# Patient Record
Sex: Female | Born: 2000 | Race: Black or African American | Hispanic: Yes | Marital: Single | Smoking: Never smoker
Health system: Southern US, Community
[De-identification: ages and names within clinical notes are randomized; demographics above are authoritative.]

## PROBLEM LIST (undated history)

## (undated) HISTORY — PX: WISDOM TOOTH EXTRACTION: SHX21

---
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
## 2021-05-22 ENCOUNTER — Encounter: Payer: Self-pay | Admitting: Allergy & Immunology

## 2021-05-22 ENCOUNTER — Ambulatory Visit (INDEPENDENT_AMBULATORY_CARE_PROVIDER_SITE_OTHER): Payer: Medicaid Other | Admitting: Allergy & Immunology

## 2021-05-22 ENCOUNTER — Other Ambulatory Visit: Payer: Self-pay

## 2021-05-22 VITALS — BP 118/80 | HR 84 | Temp 98.0°F | Resp 20 | Ht 62.0 in | Wt 203.0 lb

## 2021-05-22 DIAGNOSIS — J310 Chronic rhinitis: Secondary | ICD-10-CM | POA: Diagnosis not present

## 2021-05-22 DIAGNOSIS — J4520 Mild intermittent asthma, uncomplicated: Secondary | ICD-10-CM | POA: Diagnosis not present

## 2021-05-22 DIAGNOSIS — J45909 Unspecified asthma, uncomplicated: Secondary | ICD-10-CM | POA: Diagnosis not present

## 2021-05-22 DIAGNOSIS — J45998 Other asthma: Secondary | ICD-10-CM | POA: Diagnosis not present

## 2021-05-22 NOTE — Progress Notes (Signed)
FOLLOW UP  Date of Service/Encounter:  05/22/21   Assessment:   Mild intermittent asthma, uncomplicated   Non-allergic rhinitis   Plan/Recommendations:   1. Mild intermittent asthma, uncomplicated - Lung testing looked good today. - I do not think that we need any systemic steroids today such as prednisone. - I would like to start Flovent 110 mcg 2 puffs twice daily for 2 weeks to help decrease inflammation in the lungs. - After that, you can use this as needed for respiratory flares. - Spacer sample and demonstration provided. - Daily controller medication(s): NOTHING - Prior to physical activity: albuterol 2 puffs 10-15 minutes before physical activity. - Rescue medications: albuterol 4 puffs every 4-6 hours as needed - Changes during respiratory infections or worsening symptoms: Add on Flovent 137mcg to 2 puffs twice daily for TWO WEEKS. - Asthma control goals:  * Full participation in all desired activities (may need albuterol before activity) * Albuterol use two time or less a week on average (not counting use with activity) * Cough interfering with sleep two time or less a month * Oral steroids no more than once a year * No hospitalizations  2. Chronic non-allergic rhinitis - Continue with: Zyrtec (cetirizine) 10mg  tablet once daily as needed   3. Return in about 3 months (around 08/20/2021).    Subjective:   Nicole Mcneil is a 21 y.o. female presenting today for follow up of  Chief Complaint  Patient presents with   Asthma    Few months ago was acting up but patient thinks it was because she was around a lot of dust. Chest has been hurting lately.    Nicole Mcneil has a history of the following: Patient Active Problem List   Diagnosis Date Noted   Generalized anxiety disorder 11/04/2019   Encounter for counseling regarding initiation of other contraceptive measure 11/04/2019   PERSONAL HISTORY OF ALLERGY TO PEANUTS 12/11/2007    History obtained  from: chart review and patient.  Nicole Mcneil is a 21 y.o. female presenting for a follow up visit.  She was last seen in July 2021.  At that time, she tolerated a peanut butter challenge without adverse event.  Her asthma has been under control with albuterol 2 puffs every 4-6 hours as needed.  She underwent testing to environmental allergens in May 2021 that was negative to the entire panel.  We continued her on cetirizine 10 mg daily.  Since last visit, she has mostly done well.  She does report that over the past 6 weeks she has had intermittent chest tightness.  She thought it was from anxiety at first and tried breathing exercises and relaxation exercises without much relief.  She finally tried some albuterol with improvement.  Around the time that it started, she was around dust and fumes.  She thinks this might have triggered it.  She has had no fevers during this time.  In September 2022, she was diagnosed with bronchitis and treated with antibiotics and prednisone.  She continues to keep peanuts and tree nuts in her diet.  Otherwise, there have been no changes to her past medical history, surgical history, family history, or social history.    Review of Systems  Constitutional: Negative.  Negative for chills, fever, malaise/fatigue and weight loss.  HENT:  Positive for congestion. Negative for ear discharge, ear pain and sinus pain.   Eyes:  Negative for pain, discharge and redness.  Respiratory:  Negative for cough, sputum production, shortness of breath and wheezing.  Cardiovascular: Negative.  Negative for chest pain and palpitations.  Gastrointestinal:  Negative for abdominal pain, constipation, diarrhea, heartburn, nausea and vomiting.  Skin: Negative.  Negative for itching and rash.  Neurological:  Negative for dizziness and headaches.  Endo/Heme/Allergies:  Positive for environmental allergies. Does not bruise/bleed easily.      Objective:   Blood pressure 118/80, pulse 84,  temperature 98 F (36.7 C), temperature source Temporal, resp. rate 20, height 5\' 2"  (1.575 m), weight 203 lb (92.1 kg), SpO2 99 %. Body mass index is 37.13 kg/m.   Physical Exam:  Physical Exam Vitals reviewed.  Constitutional:      Appearance: She is well-developed.  HENT:     Head: Normocephalic and atraumatic.     Right Ear: Tympanic membrane, ear canal and external ear normal.     Left Ear: Tympanic membrane, ear canal and external ear normal.     Nose: No nasal deformity, septal deviation, mucosal edema or rhinorrhea.     Right Turbinates: Enlarged. Not swollen.     Left Turbinates: Enlarged. Not swollen.     Right Sinus: No maxillary sinus tenderness or frontal sinus tenderness.     Left Sinus: No maxillary sinus tenderness or frontal sinus tenderness.     Mouth/Throat:     Mouth: Mucous membranes are not pale and not dry.     Pharynx: Uvula midline.  Eyes:     General: Lids are normal. No allergic shiner.       Right eye: No discharge.        Left eye: No discharge.     Conjunctiva/sclera: Conjunctivae normal.     Right eye: Right conjunctiva is not injected. No chemosis.    Left eye: Left conjunctiva is not injected. No chemosis.    Pupils: Pupils are equal, round, and reactive to light.  Cardiovascular:     Rate and Rhythm: Normal rate and regular rhythm.     Heart sounds: Normal heart sounds.  Pulmonary:     Effort: Pulmonary effort is normal. No tachypnea, accessory muscle usage or respiratory distress.     Breath sounds: Normal breath sounds. No wheezing, rhonchi or rales.     Comments: Moving air well in all lung fields. No increased work of breathing noted.  Chest:     Chest wall: No tenderness.  Lymphadenopathy:     Cervical: No cervical adenopathy.  Skin:    General: Skin is warm.     Capillary Refill: Capillary refill takes less than 2 seconds.     Coloration: Skin is not pale.     Findings: No abrasion, erythema, petechiae or rash. Rash is not  papular, urticarial or vesicular.  Neurological:     Mental Status: She is alert.  Psychiatric:        Behavior: Behavior is cooperative.     Diagnostic studies:   Spirometry: results normal (FEV1: 2.43/84%, FVC: 3.08/95%, FEV1/FVC: 79%).    Spirometry consistent with normal pattern.   Allergy Studies: none        Salvatore Marvel, MD  [facility]

## 2021-05-22 NOTE — Patient Instructions (Addendum)
1. Mild intermittent asthma, uncomplicated - Lung testing looked good today. - I do not think that we need any systemic steroids today such as prednisone. - I would like to start Flovent 110 mcg 2 puffs twice daily for 2 weeks to help decrease inflammation in the lungs. - After that, you can use this as needed for respiratory flares. - Spacer sample and demonstration provided. - Daily controller medication(s): NOTHING - Prior to physical activity: albuterol 2 puffs 10-15 minutes before physical activity. - Rescue medications: albuterol 4 puffs every 4-6 hours as needed - Changes during respiratory infections or worsening symptoms: Add on Flovent to 2 puffs twice daily for TWO WEEKS. - Asthma control goals:  * Full participation in all desired activities (may need albuterol before activity) * Albuterol use two time or less a week on average (not counting use with activity) * Cough interfering with sleep two time or less a month * Oral steroids no more than once a year * No hospitalizations  2. Chronic non-allergic rhinitis - Continue with: Zyrtec (cetirizine) 10mg  tablet once daily as needed   3. Return in about 3 months (around 08/20/2021).    Please inform 10/20/2021 of any Emergency Department visits, hospitalizations, or changes in symptoms. Call us before going to the ED for breathing or allergy symptoms since we might be able to fit you in for a sick visit. Feel free to contact us anytime with any questions, problems, or concerns.  It was a pleasure to see you again today!  Websites that have reliable patient information: 1. American Academy of Asthma, Allergy, and Immunology: www.aaaai.org 2. Food Allergy Research and Education (FARE): foodallergy.org 3. Mothers of Asthmatics: http://www.asthmacommunitynetwork.org 4. American College of Allergy, Asthma, and Immunology: www.acaai.org   COVID-19 Vaccine Information can be found at:  [place] For questions related to vaccine distribution or appointments, please email [email or web address] or call [phone].   We realize that you might be concerned about having an allergic reaction to the COVID19 vaccines. To help with that concern, WE ARE OFFERING THE COVID19 VACCINES IN OUR OFFICE! Ask the front desk for dates!     Like [phone] on [place] and Instagram for our latest updates!      A healthy democracy works best when [employer] participate! Make sure you are registered to vote! If you have moved or changed any of your contact information, you will need to get this updated before voting!  In some cases, you MAY be able to register to vote online: [employer]

## 2021-08-02 ENCOUNTER — Encounter (HOSPITAL_BASED_OUTPATIENT_CLINIC_OR_DEPARTMENT_OTHER): Payer: Self-pay | Admitting: Emergency Medicine

## 2021-08-02 ENCOUNTER — Emergency Department (HOSPITAL_BASED_OUTPATIENT_CLINIC_OR_DEPARTMENT_OTHER)
Admission: EM | Admit: 2021-08-02 | Discharge: 2021-08-02 | Disposition: A | Discharge status: Home / Self Care | Payer: Medicaid Other | Attending: Emergency Medicine | Admitting: Emergency Medicine

## 2021-08-02 ENCOUNTER — Emergency Department (HOSPITAL_BASED_OUTPATIENT_CLINIC_OR_DEPARTMENT_OTHER): Payer: Medicaid Other | Admitting: Radiology

## 2021-08-02 ENCOUNTER — Other Ambulatory Visit: Payer: Self-pay

## 2021-08-02 DIAGNOSIS — R079 Chest pain, unspecified: Secondary | ICD-10-CM | POA: Diagnosis not present

## 2021-08-02 DIAGNOSIS — R0789 Other chest pain: Secondary | ICD-10-CM | POA: Diagnosis not present

## 2021-08-02 DIAGNOSIS — R42 Dizziness and giddiness: Secondary | ICD-10-CM | POA: Diagnosis not present

## 2021-08-02 LAB — CBC
HCT: 40.2 % (ref 36.0–46.0)
Hemoglobin: 13.8 g/dL (ref 12.0–15.0)
MCH: 29.3 pg (ref 26.0–34.0)
MCHC: 34.3 g/dL (ref 30.0–36.0)
MCV: 85.4 fL (ref 80.0–100.0)
Platelets: 266 10*3/uL (ref 150–400)
RBC: 4.71 MIL/uL (ref 3.87–5.11)
RDW: 11.7 % (ref 11.5–15.5)
WBC: 11.2 10*3/uL — ABNORMAL HIGH (ref 4.0–10.5)
nRBC: 0 % (ref 0.0–0.2)

## 2021-08-02 LAB — PREGNANCY, URINE: Preg Test, Ur: NEGATIVE

## 2021-08-02 LAB — BASIC METABOLIC PANEL
Anion gap: 10 (ref 5–15)
BUN: 13 mg/dL (ref 6–20)
CO2: 22 mmol/L (ref 22–32)
Calcium: 9.8 mg/dL (ref 8.9–10.3)
Chloride: 107 mmol/L (ref 98–111)
Creatinine, Ser: 0.63 mg/dL (ref 0.44–1.00)
GFR, Estimated: >60 mL/min (ref >60)
Glucose, Bld: 106 mg/dL — ABNORMAL HIGH (ref 70–99)
Potassium: 3.8 mmol/L (ref 3.5–5.1)
Sodium: 139 mmol/L (ref 135–145)

## 2021-08-02 LAB — TROPONIN I (HIGH SENSITIVITY): Troponin I (High Sensitivity): <2 ng/L (ref <18)

## 2021-08-02 NOTE — ED Provider Notes (Signed)
?[facility] EMERGENCY DEPT ?Provider Note ? ? ?CSN: [id number] ?Arrival date & time: 08/02/21  1823 ? ?  ? ?History ? ?Chief Complaint  ?Patient presents with  ? Chest Pain  ? ? ?Nicole Mcneil is a 21 y.o. female.  Presenting to the emergency department with concern for chest pain.  This has been having intermittent episodes of chest pain, feeling slightly dizzy over the past week.  Come and go at random, last for few seconds at a time.  Currently denies any chest pain. ? ?Triage RN noted grandmother noted brief episode of slurred speech resolving within minutes.  Patient denies any neurologic complaints at present. ? ?Currently patient denies any shortness of breath, chest pain or other symptoms at present. ? ?HPI ? ?  ? ?Home Medications ?Prior to Admission medications   ?Medication Sig Start Date End Date Taking? Authorizing Provider  ?escitalopram (LEXAPRO) 10 MG tablet Take 1 tablet (10 mg total) by mouth daily. 11/04/19   Melene Plan, MD  ?IBUPROFEN PO Take by mouth as needed.    [provider]  ?   ? ?Allergies    ?Patient has no active allergies.   ? ?Review of Systems   ?Review of Systems  ?Constitutional:  Negative for chills and fever.  ?HENT:  Negative for ear pain and sore throat.   ?Eyes:  Negative for pain and visual disturbance.  ?Respiratory:  Negative for cough and shortness of breath.   ?Cardiovascular:  Positive for chest pain. Negative for palpitations.  ?Gastrointestinal:  Negative for abdominal pain and vomiting.  ?Genitourinary:  Negative for dysuria and hematuria.  ?Musculoskeletal:  Negative for arthralgias and back pain.  ?Skin:  Negative for color change and rash.  ?Neurological:  Negative for seizures and syncope.  ?All other systems reviewed and are negative. ? ?Physical Exam ?Updated Vital Signs ?BP 127/81   Pulse 79   Temp 98.2 ?F (36.8 ?C)   Resp 17   Ht 5\' 2"  (1.575 m)   Wt 90.7 kg   SpO2 98%   BMI 36.58 kg/m?  ?Physical Exam ?Vitals and nursing  note reviewed.  ?Constitutional:   ?   General: She is not in acute distress. ?   Appearance: She is well-developed.  ?HENT:  ?   Head: Normocephalic and atraumatic.  ?Eyes:  ?   Conjunctiva/sclera: Conjunctivae normal.  ?Cardiovascular:  ?   Rate and Rhythm: Normal rate and regular rhythm.  ?   Heart sounds: No murmur heard. ?Pulmonary:  ?   Effort: Pulmonary effort is normal. No respiratory distress.  ?   Breath sounds: Normal breath sounds.  ?Abdominal:  ?   Palpations: Abdomen is soft.  ?   Tenderness: There is no abdominal tenderness.  ?Musculoskeletal:     ?   General: No swelling.  ?   Cervical back: Neck supple.  ?Skin: ?   General: Skin is warm and dry.  ?   Capillary Refill: Capillary refill takes less than 2 seconds.  ?Neurological:  ?   Mental Status: She is alert.  ?Psychiatric:     ?   Mood and Affect: Mood normal.  ? ? ?ED Results / Procedures / Treatments   ?Labs ?(all labs ordered are listed, but only abnormal results are displayed) ?Labs Reviewed  ?BASIC METABOLIC PANEL - Abnormal; Notable for the following components:  ?    Result Value  ? Glucose, Bld 106 (*)   ? All other components within normal limits  ?CBC - Abnormal;  Notable for the following components:  ? WBC 11.2 (*)   ? All other components within normal limits  ?PREGNANCY, URINE  ?TROPONIN I (HIGH SENSITIVITY)  ? ? ?EKG ?EKG Interpretation ? ?Date/Time:  Thursday August 02 2021 18:56:12 EDT ?Ventricular Rate:  92 ?PR Interval:  134 ?QRS Duration: 80 ?QT Interval:  344 ?QTC Calculation: 425 ?R Axis:   73 ?Text Interpretation: Normal sinus rhythm with sinus arrhythmia Normal ECG When compared with ECG of 22-Aug-2008 16:56, PREVIOUS ECG IS PRESENT Confirmed by Marianna Fuss ([id number]) on 08/02/2021 8:25:00 PM ? ?Radiology ?DG Chest 2 View ? ?Result Date: 08/02/2021 ?CLINICAL DATA:  Chest pain EXAM: CHEST - 2 VIEW COMPARISON:  None. FINDINGS: The heart size and mediastinal contours are within normal limits. Both lungs are clear. The visualized  skeletal structures are unremarkable. IMPRESSION: Negative. Electronically Signed   By: Charlett Nose M.D.   On: 08/02/2021 19:40   ? ?Procedures ?Procedures  ? ? ?Medications Ordered in ED ?Medications - No data to display ? ?ED Course/ Medical Decision Making/ A&P ?  ?                        ?Medical Decision Making ?Amount and/or Complexity of Data Reviewed ?Labs: ordered. ?Radiology: ordered. ? ? ?21 year old girl presenting to ER with intermittent episodes of brief chest discomfort over the past week.  On physical exam she is well-appearing in no distress, her vitals are normal, her lungs are clear.  EKG without acute ischemic change, troponin undetectable, doubt ACS.  No tachycardia, tachypnea, hypoxia, doubt PE.  I independently reviewed CXR, no acute findings noted.  Patient is currently asymptomatic, given the a forementioned work-up, feel she can be discharged and managed in the outpatient setting.  Advised follow-up with her primary care doctor. ? ? ?After the discussed management above, the patient was determined to be safe for discharge.  The patient was in agreement with this plan and all questions regarding their care were answered.  ED return precautions were discussed and the patient will return to the ED with any significant worsening of condition. ? ? ? ? ? ? ? ? ?Final Clinical Impression(s) / ED Diagnoses ?Final diagnoses:  ?Chest pain, unspecified type  ? ? ?Rx / DC Orders ?ED Discharge Orders   ? ? None  ? ?  ? ? ?  ?Milagros Loll, MD ?08/04/21 1351 ? ?

## 2021-08-02 NOTE — ED Notes (Signed)
Rt talked to Pt about current asthma status, encouraged her to see a pulmonologist, and obtain PFT, especially if she continues to have worsening problems with her asthma. She was given a list of [facility] Pulmonologist as well. ?

## 2021-08-02 NOTE — ED Triage Notes (Signed)
Pt presents for CP x 1 week, dizzy spells, and her grandmother noted a brief episode of slurred speech that resolved within minutes, more forgetful than usual. CP ( general chest, 6/10, feels like tightness). ?Uses a nexplanon for birth control.  ?Denies LOC, V/D, fever, chills, urinary sx, abd pain ?

## 2021-08-02 NOTE — Discharge Instructions (Signed)
Follow-up with your primary care doctor regarding your episode of chest pain.  Come back here if you develop worsening chest pain or difficulty breathing or other concerning symptom. ?

## 2021-08-21 ENCOUNTER — Ambulatory Visit (INDEPENDENT_AMBULATORY_CARE_PROVIDER_SITE_OTHER): Payer: Medicaid Other | Admitting: Allergy & Immunology

## 2021-08-21 ENCOUNTER — Encounter: Payer: Self-pay | Admitting: Allergy & Immunology

## 2021-08-21 VITALS — BP 120/76 | HR 83 | Temp 98.1°F | Resp 18 | Ht 62.0 in | Wt 210.5 lb

## 2021-08-21 DIAGNOSIS — J310 Chronic rhinitis: Secondary | ICD-10-CM | POA: Diagnosis not present

## 2021-08-21 DIAGNOSIS — J4520 Mild intermittent asthma, uncomplicated: Secondary | ICD-10-CM

## 2021-08-21 MED ORDER — ALBUTEROL SULFATE HFA 108 (90 BASE) MCG/ACT IN AERS: 2.0000 | INHALATION_SPRAY | Freq: Four times a day (QID) | RESPIRATORY_TRACT | 2 refills | Status: AC | PRN

## 2021-08-21 MED ORDER — FLUTICASONE PROPIONATE HFA 110 MCG/ACT IN AERO: 2.0000 | INHALATION_SPRAY | Freq: Two times a day (BID) | RESPIRATORY_TRACT | 12 refills | Status: AC

## 2021-08-21 NOTE — Progress Notes (Signed)
? ?FOLLOW UP ? ?Date of Service/Encounter:  08/21/21 ? ? ?Assessment:  ?  ?Mild intermittent asthma, uncomplicated ?  ?Non-allergic rhinitis ?  ? ?Plan/Recommendations:  ? ?1. Mild intermittent asthma, uncomplicated ?- Lung testing looked good today. ?- I am sorry that the Flovent was not sent in last time. ?- Daily controller medication(s): NOTHING ?- Prior to physical activity: albuterol 2 puffs 10-15 minutes before physical activity. ?- Rescue medications: albuterol 4 puffs every 4-6 hours as needed ?- Changes during respiratory infections or worsening symptoms: Add on Flovent 136mcg to 2 puffs twice daily for TWO WEEKS. ?- Asthma control goals:  ?* Full participation in all desired activities (may need albuterol before activity) ?* Albuterol use two time or less a week on average (not counting use with activity) ?* Cough interfering with sleep two time or less a month ?* Oral steroids no more than once a year ?* No hospitalizations ? ?2. Chronic non-allergic rhinitis ?- Continue with: Zyrtec (cetirizine) 10mg  tablet once daily as needed ?  ?3. Return in about 6 months (around 02/20/2022).  ? ?Subjective:  ? ?Nicole Mcneil is a 21 y.o. female presenting today for follow up of  ?Chief Complaint  ?Patient presents with  ? Follow-up  ? ? ?Nicole Mcneil has a history of the following: ?Patient Active Problem List  ? Diagnosis Date Noted  ? Generalized anxiety disorder 11/04/2019  ? Encounter for counseling regarding initiation of other contraceptive measure 11/04/2019  ? PERSONAL HISTORY OF ALLERGY TO PEANUTS 12/11/2007  ? ? ?History obtained from: chart review and patient. ? ?Nicole Mcneil is a 21 y.o. female presenting for a follow up visit.  She was last seen in January 2023.  At that time, lung testing looked great.  We started Flovent 110 mcg 2 puffs twice daily to help decrease inflammation in her lungs.  We continue with Zyrtec 10 mg daily as needed. ? ?Since last visit, she done very  well. ? ?Asthma/Respiratory Symptom History: We never did send in the Flovent at the last visit.  She says she called to requested, but we never answered.  Regardless, she seems to do well on prednisone. She has not been emergency room nor she been using her rescue inhaler much at all. ? ?Allergic Rhinitis Symptom History: Her rhinitis symptoms are under fair control.  She remains on Zyrtec as needed.  She has not been using any antibiotics been on nose spray. ? ?She remains busy at work.  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  She seems to be enjoying that quite a bit. They are trying to move out the gas and diesel trucks because they have a host of electric vehicles coming in.  ? ?Otherwise, there have been no changes to her past medical history, surgical history, family history, or social history. ? ? ? ?Review of Systems  ?Constitutional: Negative.  Negative for chills, fever, malaise/fatigue and weight loss.  ?HENT: Negative.  Negative for congestion, ear discharge, ear pain and sinus pain.   ?Eyes:  Negative for pain, discharge and redness.  ?Respiratory:  Negative for cough, sputum production, shortness of breath and wheezing.   ?Cardiovascular: Negative.  Negative for chest pain and palpitations.  ?Gastrointestinal:  Negative for abdominal pain, constipation, diarrhea, heartburn, nausea and vomiting.  ?Skin: Negative.  Negative for itching and rash.  ?Neurological:  Negative for dizziness and headaches.  ?Endo/Heme/Allergies:  Negative for environmental allergies. Does not bruise/bleed easily.   ? ? ? ?  Objective:  ? ?Blood pressure 120/76, pulse 83, temperature 98.1 ?F (36.7 ?C), resp. rate 18, height 5\' 2"  (1.575 m), weight 210 lb 8 oz (95.5 kg), SpO2 98 %. ?Body mass index is 38.5 kg/m?. ? ? ? ?Physical Exam ?Vitals reviewed.  ?Constitutional:   ?   Appearance: She is well-developed.  ?HENT:  ?   Head: Normocephalic and atraumatic.  ?   Right Ear:  Tympanic membrane, ear canal and external ear normal.  ?   Left Ear: Tympanic membrane, ear canal and external ear normal.  ?   Nose: No nasal deformity, septal deviation, mucosal edema or rhinorrhea.  ?   Right Turbinates: Enlarged. Not swollen.  ?   Left Turbinates: Enlarged. Not swollen.  ?   Right Sinus: No maxillary sinus tenderness or frontal sinus tenderness.  ?   Left Sinus: No maxillary sinus tenderness or frontal sinus tenderness.  ?   Mouth/Throat:  ?   Mouth: Mucous membranes are not pale and not dry.  ?   Pharynx: Uvula midline.  ?Eyes:  ?   General: Lids are normal. No allergic shiner.    ?   Right eye: No discharge.     ?   Left eye: No discharge.  ?   Conjunctiva/sclera: Conjunctivae normal.  ?   Right eye: Right conjunctiva is not injected. No chemosis. ?   Left eye: Left conjunctiva is not injected. No chemosis. ?   Pupils: Pupils are equal, round, and reactive to light.  ?Cardiovascular:  ?   Rate and Rhythm: Normal rate and regular rhythm.  ?   Heart sounds: Normal heart sounds.  ?Pulmonary:  ?   Effort: Pulmonary effort is normal. No tachypnea, accessory muscle usage or respiratory distress.  ?   Breath sounds: Normal breath sounds. No wheezing, rhonchi or rales.  ?   Comments: Moving air well in all lung fields. No increased work of breathing noted.  ?Chest:  ?   Chest wall: No tenderness.  ?Lymphadenopathy:  ?   Cervical: No cervical adenopathy.  ?Skin: ?   General: Skin is warm.  ?   Capillary Refill: Capillary refill takes less than 2 seconds.  ?   Coloration: Skin is not pale.  ?   Findings: No abrasion, erythema, petechiae or rash. Rash is not papular, urticarial or vesicular.  ?Neurological:  ?   Mental Status: She is alert.  ?Psychiatric:     ?   Behavior: Behavior is cooperative.  ?  ? ?Diagnostic studies: none ? ? ? ? ? ?  ?Salvatore Marvel, MD  ?[facility] [place] ? ? ? ? ? ? ?

## 2021-08-21 NOTE — Patient Instructions (Addendum)
1. Mild intermittent asthma, uncomplicated ?- Lung testing looked good today. ?- I am sorry that the Flovent was not sent in last time. ?- Daily controller medication(s): NOTHING ?- Prior to physical activity: albuterol 2 puffs 10-15 minutes before physical activity. ?- Rescue medications: albuterol 4 puffs every 4-6 hours as needed ?- Changes during respiratory infections or worsening symptoms: Add on Flovent to 2 puffs twice daily for TWO WEEKS. ?- Asthma control goals:  ?* Full participation in all desired activities (may need albuterol before activity) ?* Albuterol use two time or less a week on average (not counting use with activity) ?* Cough interfering with sleep two time or less a month ?* Oral steroids no more than once a year ?* No hospitalizations ? ?2. Chronic non-allergic rhinitis ?- Continue with: Zyrtec (cetirizine) 10mg  tablet once daily as needed ?  ?3. Return in about 6 months (around 02/20/2022).  ? ? ?Please inform 04/22/2022 of any Emergency Department visits, hospitalizations, or changes in symptoms. Call us before going to the ED for breathing or allergy symptoms since we might be able to fit you in for a sick visit. Feel free to contact us anytime with any questions, problems, or concerns. ? ?It was a pleasure to see you again today! ? ?Websites that have reliable patient information: ?1. American Academy of Asthma, Allergy, and Immunology: www.aaaai.org ?2. Food Allergy Research and Education (FARE): foodallergy.org ?3. Mothers of Asthmatics: http://www.asthmacommunitynetwork.org ?4. [place] of Allergy, Asthma, and Immunology: [employer] ? ? ?COVID-19 Vaccine Information can be found at: MissingWeapons.ca For questions related to vaccine distribution or appointments, please email [email or web address] or call [phone].  ? ?We realize that you might be concerned about having an allergic reaction to the COVID19  vaccines. To help with that concern, WE ARE OFFERING THE COVID19 VACCINES IN OUR OFFICE! Ask the front desk for dates!  ? ? ? ??Like? [phone] on Facebook and Instagram for our latest updates!  ?  ? ? ?A healthy democracy works best when [place] participate! Make sure you are registered to vote! If you have moved or changed any of your contact information, you will need to get this updated before voting! ? ?In some cases, you MAY be able to register to vote online: [employer] ? ? ? ? ? ? ? ? ? ?

## 2021-08-22 ENCOUNTER — Encounter: Payer: Self-pay | Admitting: Allergy & Immunology

## 2021-09-24 ENCOUNTER — Ambulatory Visit (INDEPENDENT_AMBULATORY_CARE_PROVIDER_SITE_OTHER): Payer: BLUE CROSS/BLUE SHIELD | Admitting: Family Medicine

## 2021-09-24 ENCOUNTER — Encounter: Payer: Self-pay | Admitting: Family Medicine

## 2021-09-24 VITALS — BP 119/70 | HR 84 | Ht 62.0 in | Wt 210.0 lb

## 2021-09-24 DIAGNOSIS — Z113 Encounter for screening for infections with a predominantly sexual mode of transmission: Secondary | ICD-10-CM | POA: Diagnosis not present

## 2021-09-24 DIAGNOSIS — G43709 Chronic migraine without aura, not intractable, without status migrainosus: Secondary | ICD-10-CM | POA: Insufficient documentation

## 2021-09-24 DIAGNOSIS — Z1159 Encounter for screening for other viral diseases: Secondary | ICD-10-CM

## 2021-09-24 DIAGNOSIS — G43009 Migraine without aura, not intractable, without status migrainosus: Secondary | ICD-10-CM | POA: Diagnosis not present

## 2021-09-24 MED ORDER — SUMATRIPTAN SUCCINATE 50 MG PO TABS: ORAL_TABLET | ORAL | 0 refills | Status: DC

## 2021-09-24 MED ORDER — KETOROLAC TROMETHAMINE 30 MG/ML IJ SOLN
30.0000 mg | Freq: Once | INTRAMUSCULAR | Status: AC
  Administered 2021-09-24: 30 mg via INTRAMUSCULAR

## 2021-09-24 NOTE — Progress Notes (Signed)
    SUBJECTIVE:   CHIEF COMPLAINT / HPI:   Headaches -Intermittent episodes lasting 6 to 8 hours -Worsening gradually, "really bad last week" -In the last week has occurred daily, resolve spontaneously -Throbbing pain, can be any location, unilateral and bilateral at times -Concurrent dizziness, difficulty concentrating from pain, blurry vision, black spots in vision one day last week, photophobia and phonophobia, nauseous, left hand tingly one headache, some diaphoresis -Denies vomiting, difficulty with using arms or legs, no focal deficits, no chest pain, SOB -Relieving factors:  dark room -Aggravating factors: loud noises, bright lights -Has used OTC analgesics about 5 times in last month (mostly last week with worsening headaches) -Previous history of migraine with aura, resolved when she stopped OCPs, was previously on metoprolol for prophylaxis (stopped June 2021) -Normal head CT 02/16/2018  PERTINENT  PMH / PSH:  Patient Active Problem List   Diagnosis Date Noted   Migraine without aura and without status migrainosus, not intractable 09/24/2021   Generalized anxiety disorder 11/04/2019   PERSONAL HISTORY OF ALLERGY TO PEANUTS 12/11/2007    OBJECTIVE:   BP 119/70   Pulse 84   Ht 5\' 2"  (1.575 m)   Wt 210 lb (95.3 kg)   BMI 38.41 kg/m    PHQ-9:     09/24/2021    3:09 PM 11/08/2019    9:34 PM 11/04/2019    9:14 AM  Depression screen PHQ 2/9  Decreased Interest 1 2 2   Down, Depressed, Hopeless 0 1 1  PHQ - 2 Score 1 3 3   Altered sleeping 3 1   Tired, decreased energy 3 1   Change in appetite 2 1   Feeling bad or failure about yourself  0 0   Trouble concentrating 2 0   Moving slowly or fidgety/restless 1 0   Suicidal thoughts 0 0   PHQ-9 Score 12 6     Physical Exam General: Awake, alert, oriented HEENT: PERRL, bilateral TM pearly pink and flat, bilateral external auditory canals with minimal cerumen burden, no lesions, nasal mucosa slightly edematous, oral  mucosa pink, moist, without lesion, intact dentition without obvious cavity Lymph: No palpable lymphedema of head or neck Cardiovascular: Regular rate and rhythm, S1 and S2 present, no murmurs auscultated Respiratory: Lung fields clear to auscultation bilaterally Neuro: Grip strength and BUE strength 5/5 and equal bilaterally  ASSESSMENT/PLAN:   Migraine without aura and without status migrainosus, not intractable Acute, 1 week of daily episodes 6 to 8 hours each.  No red flags on physical exam. Previously on prophylaxis, however not at this time.  IM Toradol in clinic today 30 mg.  Rx sumatriptan 50 mg x 10 tablets.  Follow-up 1 week scheduled, ED and return precautions given.    4/9, MD [facility]

## 2021-09-24 NOTE — Patient Instructions (Signed)
It was wonderful to meet you today. Thank you for allowing me to be a part of your care. Below is a short summary of what we discussed at your visit today: ? ?Migraine ?I believe the headaches you are experiencing are migraines. ? ?Today we gave you an injection of a medicine called Toradol.  This is an anti-inflammatory in the same class as ibuprofen and Aleve. ? ?I also wrote you a prescription for sumatriptan.  This is a migraine stopping medication.  Start the medicine after you get home today.  ? ?You will take 1 tablet at the onset of a headache, then repeat in 2 hours if the systems persist or return.  Do not take more than 2 tablets in 1 day. ? ?Come back in 1 week for a follow-up appointment to talk about your migraines.  We can also cover shoulder pain and wrist pain. ? ?Please bring all of your medications to every appointment! ? ?If you have any questions or concerns, please do not hesitate to contact us via phone or MyChart message.  ? ?Fayette Pho, MD  ? ?Community resources everyone should know about: ? ?Cooking and Nutrition Classes ?The [employer] in [place] provides many classes at low or no cost to [employer], nutrition, and agriculture.  Their website offers a huge variety of information related to topics such as gardening, nutrition, cooking, parenting, and health.  Also listed are classes and events, both online and in-person.  Check out their website here: https://[place].[employer].nl  ? ?Food finder app ?Download the [employer] App or Call 211 to easily find food banks and pantries and other resources nearby.  ?

## 2021-09-25 NOTE — Assessment & Plan Note (Signed)
Acute, 1 week of daily episodes 6 to 8 hours each.  No red flags on physical exam. Previously on prophylaxis, however not at this time.  IM Toradol in clinic today 30 mg.  Rx sumatriptan 50 mg x 10 tablets.  Follow-up 1 week scheduled, ED and return precautions given. ?

## 2021-09-26 ENCOUNTER — Other Ambulatory Visit (HOSPITAL_COMMUNITY): Payer: Self-pay

## 2021-10-02 ENCOUNTER — Ambulatory Visit (INDEPENDENT_AMBULATORY_CARE_PROVIDER_SITE_OTHER): Payer: BLUE CROSS/BLUE SHIELD | Admitting: Family Medicine

## 2021-10-02 ENCOUNTER — Encounter: Payer: Self-pay | Admitting: Family Medicine

## 2021-10-02 VITALS — BP 111/78 | HR 82 | Ht 62.0 in | Wt 215.6 lb

## 2021-10-02 DIAGNOSIS — Z3009 Encounter for other general counseling and advice on contraception: Secondary | ICD-10-CM | POA: Diagnosis not present

## 2021-10-02 DIAGNOSIS — G43009 Migraine without aura, not intractable, without status migrainosus: Secondary | ICD-10-CM

## 2021-10-02 NOTE — Progress Notes (Signed)
? ? ?  SUBJECTIVE:  ? ?CHIEF COMPLAINT / HPI:  ? ?Migraine ?Last seen [facility] 5/8 with 1 week history of daily migraine.  At that time, she was given Toradol 30 mg IM in clinic and prescribed sumatriptan 50 mg x 10 tablets.  Patient reports the headache resolved (brief episode on Saturday with resolution from ibuprofen).  She has not yet picked up the sumatriptan from the pharmacy because it has not been marked as ready yet. ? ?Left arm pain at Nexplanon site ?Patient reports intermittent sharp pains of her inner upper left arm right over the Nexplanon.  No radiation, redness, swelling, warmth.  She does not believe the Nexplanon has moved from its original site. ? ?PERTINENT  PMH / PSH:  ?Patient Active Problem List  ? Diagnosis Date Noted  ? Migraine without aura and without status migrainosus, not intractable 09/24/2021  ? Generalized anxiety disorder 11/04/2019  ? Nexplanon in place 11/04/2019  ? PERSONAL HISTORY OF ALLERGY TO PEANUTS 12/11/2007  ?  ?OBJECTIVE:  ? ?BP 111/78   Pulse 82   Ht 5\' 2"  (1.575 m)   Wt 215 lb 9.6 oz (97.8 kg)   SpO2 99%   BMI 39.43 kg/m?   ? ?PHQ-9:  ? ?  10/02/2021  ?  4:20 PM 09/24/2021  ?  3:09 PM 11/08/2019  ?  9:34 PM  ?Depression screen PHQ 2/9  ?Decreased Interest 1 1 2   ?Down, Depressed, Hopeless 0 0 1  ?PHQ - 2 Score 1 1 3   ?Altered sleeping 3 3 1   ?Tired, decreased energy 2 3 1   ?Change in appetite 2 2 1   ?Feeling bad or failure about yourself  0 0 0  ?Trouble concentrating 2 2 0  ?Moving slowly or fidgety/restless 2 1 0  ?Suicidal thoughts 0 0 0  ?PHQ-9 Score 12 12 6   ?Difficult doing work/chores Somewhat difficult    ?  ?Physical Exam ?General: Awake, alert, oriented ?Cardiovascular: Regular rate and rhythm, S1 and S2 present, no murmurs auscultated ?Respiratory: Lung fields clear to auscultation bilaterally ?Extremities: Left upper arm unremarkable, Nexplanon easily palpated at proper depth and location, no overlying fluctuance, redness, warmth ? ?ASSESSMENT/PLAN:  ? ?Migraine  without aura and without status migrainosus, not intractable ?Resolved with Toradol 30 mg IM in clinic 5/8.  She did have a small episode the following Saturday that resolved with ibuprofen.  She has had to pick up the sumatriptan, but I did encourage her to do so in case she needs it in the future.  Return precautions given. ? ?Nexplanon in place ?Intermittent brief sharp pains overlying Nexplanon likely due to superficial nerve irritation.  No red flags.  Palpation reveals Nexplanon at proper location and depth.  Discussed with patient likelihood of benign etiology, offered removal in the future if she wishes.  Return precautions given. ?  ? ? ? , MD ?[facility]  ?

## 2021-10-02 NOTE — Assessment & Plan Note (Signed)
Intermittent brief sharp pains overlying Nexplanon likely due to superficial nerve irritation.  No red flags.  Palpation reveals Nexplanon at proper location and depth.  Discussed with patient likelihood of benign etiology, offered removal in the future if she wishes.  Return precautions given. ?

## 2021-10-02 NOTE — Assessment & Plan Note (Signed)
Resolved with Toradol 30 mg IM in clinic 5/8.  She did have a small episode the following Saturday that resolved with ibuprofen.  She has had to pick up the sumatriptan, but I did encourage her to do so in case she needs it in the future.  Return precautions given. ?

## 2021-10-02 NOTE — Patient Instructions (Signed)
It was wonderful to see you today. Thank you for allowing me to be a part of your care. Below is a short summary of what we discussed at your visit today: ? ?Migraine ?I am so happy your migraine went away! Please fill the sumatriptan prescription at the pharmacy.  Please let us know if you have any problems there, just give [place] a MyChart message or call. ? ?If you have migraines in the future that do not respond to the sumatriptan, please come back for care. ? ?Nexplanon ?The intermittent sharp pain you are describing sounds like local nerve irritation.  Since it is localized, it is probably one of the small nerves that innervates the skin and surrounding area. ? ?It has becomes quite bothersome, please let us know and we can remove it in favor of either the other arm or different method of birth control. ? ?If you feel your Nexplanon and has changed positions, become much deeper, or you cannot find it please let us know immediately. ? ? ? ?If you have any questions or concerns, please do not hesitate to contact us via phone or MyChart message.  ? ?Fayette Pho, MD  ?

## 2021-10-16 ENCOUNTER — Telehealth: Payer: Self-pay | Admitting: *Deleted

## 2021-10-16 NOTE — Telephone Encounter (Signed)
Tried to contact pt and VM was full.  Wanted to ask about the message she sent about the pharmacy to see what medication she was referencing so we could try to figure out what needs to be done.   Jasie Chalfant "Ari"  P [facility] Admin (supporting Mychart, Generic) 10 days ago   Hey I went the pharmacy and they said the sent it back to the office bc some insurance info need to be filed      .Bailey Faiella Zimmerman Rumple, CMA

## 2021-10-23 ENCOUNTER — Encounter: Payer: Self-pay | Admitting: *Deleted

## 2022-02-04 NOTE — Progress Notes (Unsigned)
    SUBJECTIVE:   CHIEF COMPLAINT / HPI:   Nicole Mcneil is a 21 y.o. female who presents to the [facility] clinic today to discuss the following concerns:   Lump in Right Breast Discovered last week incidentally. Was tender when she felt it. Smaller than a golf ball. Has Nexplanon in place so periods have been irregular. Has had Nexplanon in place for the last 2 year. Mother has history of lymphoma. Great grandfather on maternal side had liver cancer. Believes that maternal aunt has history of breast cancer- unclear when she was diagnosed. Denies Fhx of endometrial or uterine cancer.   PERTINENT  PMH / PSH: GAD, Migraine   OBJECTIVE:   BP 110/72   Pulse 79   Ht 5\' 2"  (1.575 m)   Wt 210 lb (95.3 kg)   BMI 38.41 kg/m    General: NAD, pleasant, able to participate in exam Breast: No nipple discharge, no erythema, dimpling or other skin abnormalities on either breat. In the right breast- around the 1 o'clock position she has a mildly tender approximately 2x2 cm lump that is mobile. Normal left breast exam Respiratory: Normal respiratory effort Psych: Normal affect and mood  Breast exam chaperoned by Mercer Pod, CMA  ASSESSMENT/PLAN:   Breast lump Approximately 2 x 2 centimeter lump to the 1 o'clock position in the right breast.  Tender on palpation.  No other red flag findings.  Given that she is otherwise young and healthy, I suspect likely fibrocystic changes.  Should follow-up in 2 weeks for recheck, if persists can order ultrasound at that time.    Sharion Settler, [place]

## 2022-02-04 NOTE — Patient Instructions (Incomplete)
It was wonderful to see you today.  Today we talked about:  -The breast changes may be normal. We will monitor for now.  -Please return in 2 weeks for a recheck. If still present or changing, we can get an ultrasound at that time.  Thank you for choosing [facility].   Please call [phone] with any questions about today's appointment.  Please be sure to schedule follow up at the front  desk before you leave today.   Sabino Dick, DO PGY-3 Family Medicine

## 2022-02-05 ENCOUNTER — Encounter: Payer: Self-pay | Admitting: Family Medicine

## 2022-02-05 ENCOUNTER — Ambulatory Visit (INDEPENDENT_AMBULATORY_CARE_PROVIDER_SITE_OTHER): Payer: BLUE CROSS/BLUE SHIELD | Admitting: Family Medicine

## 2022-02-05 DIAGNOSIS — N6312 Unspecified lump in the right breast, upper inner quadrant: Secondary | ICD-10-CM

## 2022-02-05 DIAGNOSIS — N63 Unspecified lump in unspecified breast: Secondary | ICD-10-CM | POA: Insufficient documentation

## 2022-02-05 NOTE — Assessment & Plan Note (Signed)
Approximately 2 x 2 centimeter lump to the 1 o'clock position in the right breast.  Tender on palpation.  No other red flag findings.  Given that she is otherwise young and healthy, I suspect likely fibrocystic changes.  Should follow-up in 2 weeks for recheck, if persists can order ultrasound at that time.

## 2022-02-11 ENCOUNTER — Emergency Department (HOSPITAL_BASED_OUTPATIENT_CLINIC_OR_DEPARTMENT_OTHER)
Admission: EM | Admit: 2022-02-11 | Discharge: 2022-02-11 | Disposition: A | Discharge status: Home / Self Care | Payer: BLUE CROSS/BLUE SHIELD | Attending: Emergency Medicine | Admitting: Emergency Medicine

## 2022-02-11 ENCOUNTER — Other Ambulatory Visit: Payer: Self-pay

## 2022-02-11 ENCOUNTER — Encounter (HOSPITAL_BASED_OUTPATIENT_CLINIC_OR_DEPARTMENT_OTHER): Payer: Self-pay | Admitting: Emergency Medicine

## 2022-02-11 ENCOUNTER — Emergency Department (HOSPITAL_BASED_OUTPATIENT_CLINIC_OR_DEPARTMENT_OTHER): Payer: BLUE CROSS/BLUE SHIELD | Admitting: Radiology

## 2022-02-11 DIAGNOSIS — X501XXA Overexertion from prolonged static or awkward postures, initial encounter: Secondary | ICD-10-CM | POA: Diagnosis not present

## 2022-02-11 DIAGNOSIS — M25571 Pain in right ankle and joints of right foot: Secondary | ICD-10-CM | POA: Insufficient documentation

## 2022-02-11 DIAGNOSIS — Y9367 Activity, basketball: Secondary | ICD-10-CM | POA: Insufficient documentation

## 2022-02-11 DIAGNOSIS — S93401A Sprain of unspecified ligament of right ankle, initial encounter: Secondary | ICD-10-CM

## 2022-02-11 MED ORDER — IBUPROFEN 600 MG PO TABS: 600.0000 mg | ORAL_TABLET | Freq: Four times a day (QID) | ORAL | 0 refills | Status: DC | PRN

## 2022-02-11 NOTE — Discharge Instructions (Signed)
Take the anti-inflammatory medication as prescribed.  Follow-up with your primary doctor or the orthopedic doctor.  Use ice, elevation, crutches and bear weight as tolerated.  Return to the ED with new or worsening symptoms

## 2022-02-11 NOTE — ED Triage Notes (Signed)
Pt arrives to ED with c/o right ankle pain. She reports she twisted yesterday while playing basketball.

## 2022-02-11 NOTE — ED Provider Notes (Signed)
[facility] EMERGENCY DEPT Provider Note   CSN: [id number] Arrival date & time: 02/11/22  7893     History  Chief Complaint  Patient presents with   Ankle Pain    Nicole Mcneil is a 21 y.o. female.  Right ankle pain after rolling her ankle playing basketball yesterday.  Believes someone stepped on it as well.  Ankle everted while she was playing basketball and she fell to the ground but did not hit her head.  Increased pain today with difficulty bearing weight.  No weakness, numbness or tingling.  No neck or back pain.  The history is provided by the patient.  Ankle Pain      Home Medications Prior to Admission medications   Medication Sig Start Date End Date Taking? Authorizing Provider  albuterol (VENTOLIN HFA) 108 (90 Base) MCG/ACT inhaler Inhale 2 puffs into the lungs every 6 (six) hours as needed for wheezing or shortness of breath. 08/21/21   Alfonse Spruce, MD  aspirin-acetaminophen-caffeine (EXCEDRIN MIGRAINE) [phone] MG tablet Take by mouth every 6 (six) hours as needed.    [provider]  fluticasone (FLOVENT HFA) 110 MCG/ACT inhaler Inhale 2 puffs into the lungs in the morning and at bedtime. Add during respiratory flares. 08/21/21   Alfonse Spruce, MD  IBUPROFEN PO Take by mouth as needed. Patient not taking: Reported on 02/05/2022    [provider]  SUMAtriptan (IMITREX) 50 MG tablet Take one tablet at onset of migraine. May repeat in 2 hours if headache persists or recurs. Do not take more than 2 tablets in a day. Patient not taking: Reported on 02/05/2022 09/24/21   Fayette Pho, MD      Allergies    Patient has no known allergies.    Review of Systems   Review of Systems  Musculoskeletal:  Positive for arthralgias and myalgias.    Physical Exam Updated Vital Signs BP 135/82 (BP Location: Right Arm)   Pulse 78   Temp 98.1 F (36.7 C) (Oral)   Resp 16   SpO2 99%  Physical Exam Vitals and nursing note  reviewed.  Constitutional:      General: She is not in acute distress.    Appearance: She is well-developed.  HENT:     Head: Normocephalic and atraumatic.     Mouth/Throat:     Pharynx: No oropharyngeal exudate.  Eyes:     Conjunctiva/sclera: Conjunctivae normal.     Pupils: Pupils are equal, round, and reactive to light.  Neck:     Comments: No meningismus. Cardiovascular:     Rate and Rhythm: Normal rate and regular rhythm.     Heart sounds: Normal heart sounds. No murmur heard. Pulmonary:     Effort: Pulmonary effort is normal. No respiratory distress.     Breath sounds: Normal breath sounds.  Abdominal:     Palpations: Abdomen is soft.     Tenderness: There is no abdominal tenderness. There is no guarding or rebound.  Musculoskeletal:        General: Swelling and tenderness present. Normal range of motion.     Cervical back: Normal range of motion and neck supple.     Comments: Diffuse tenderness to right ankle.  No deformity.  Intact DP and PT pulse.  Ankle flexion extension intact able to wiggle toes.  Achilles function appears to be intact.  No proximal fibular pain.  Skin:    General: Skin is warm.  Neurological:     Mental Status: She is  alert and oriented to person, place, and time.     Cranial Nerves: No cranial nerve deficit.     Motor: No abnormal muscle tone.     Coordination: Coordination normal.     Comments:  5/5 strength throughout. CN 2-12 intact.Equal grip strength.   Psychiatric:        Behavior: Behavior normal.     ED Results / Procedures / Treatments   Labs (all labs ordered are listed, but only abnormal results are displayed) Labs Reviewed - No data to display  EKG None  Radiology DG Ankle Complete Right  Result Date: 02/11/2022 CLINICAL DATA:  Patient rolled ankle playing basketball EXAM: RIGHT ANKLE - COMPLETE  VIEW COMPARISON:  None Available. FINDINGS: There is no evidence of fracture, dislocation, or joint effusion. There is no evidence  of arthropathy or other focal bone abnormality. Preserved ankle mortise. Soft tissues are unremarkable. IMPRESSION: No acute fracture or dislocation. Electronically Signed   By: Marin Roberts M.D.   On: 02/11/2022 08:48    Procedures Procedures    Medications Ordered in ED Medications - No data to display  ED Course/ Medical Decision Making/ A&P                           Medical Decision Making Amount and/or Complexity of Data Reviewed Labs: ordered. Decision-making details documented in ED Course. Radiology: ordered and independent interpretation performed. Decision-making details documented in ED Course. ECG/medicine tests: ordered and independent interpretation performed. Decision-making details documented in ED Course.  Ankle pain after rolling it during basketball yesterday.  No other injury.  Neurovascularly intact.  X-rays negative for fracture or dislocation.  Results reviewed and interpreted by me.  We will treat for sprain or strain.  Recommend ice, elevation, brace, NSAIDs, PCP follow-up.  We will also give crutches.  Return precautions discussed.         Final Clinical Impression(s) / ED Diagnoses Final diagnoses:  None    Rx / DC Orders ED Discharge Orders     None         Jaymz Traywick, Annie Main, MD 02/11/22 [phone]

## 2022-02-21 ENCOUNTER — Encounter: Payer: Self-pay | Admitting: Family Medicine

## 2022-02-21 ENCOUNTER — Encounter: Payer: Self-pay | Admitting: Allergy & Immunology

## 2022-02-21 ENCOUNTER — Ambulatory Visit (INDEPENDENT_AMBULATORY_CARE_PROVIDER_SITE_OTHER): Payer: BLUE CROSS/BLUE SHIELD | Admitting: Allergy & Immunology

## 2022-02-21 ENCOUNTER — Ambulatory Visit (INDEPENDENT_AMBULATORY_CARE_PROVIDER_SITE_OTHER): Payer: BLUE CROSS/BLUE SHIELD | Admitting: Family Medicine

## 2022-02-21 VITALS — BP 112/64 | HR 77 | Temp 98.2°F | Resp 16 | Wt 208.0 lb

## 2022-02-21 VITALS — BP 100/70 | HR 97 | Ht 62.0 in | Wt 207.0 lb

## 2022-02-21 DIAGNOSIS — Z23 Encounter for immunization: Secondary | ICD-10-CM

## 2022-02-21 DIAGNOSIS — Z113 Encounter for screening for infections with a predominantly sexual mode of transmission: Secondary | ICD-10-CM | POA: Diagnosis not present

## 2022-02-21 DIAGNOSIS — J4520 Mild intermittent asthma, uncomplicated: Secondary | ICD-10-CM | POA: Diagnosis not present

## 2022-02-21 DIAGNOSIS — N6312 Unspecified lump in the right breast, upper inner quadrant: Secondary | ICD-10-CM

## 2022-02-21 DIAGNOSIS — J310 Chronic rhinitis: Secondary | ICD-10-CM | POA: Diagnosis not present

## 2022-02-21 NOTE — Patient Instructions (Addendum)
1. Mild intermittent asthma, uncomplicated - Lung testing looked good today. - I am sorry that the Flovent was not sent in last time. - Daily controller medication(s): NOTHING - Prior to physical activity: albuterol 2 puffs 10-15 minutes before physical activity. - Rescue medications: albuterol 4 puffs every 4-6 hours as needed - Changes during respiratory infections or worsening symptoms: Add on Flovent 179mcg to 2 puffs twice daily for TWO WEEKS. - Asthma control goals:  * Full participation in all desired activities (may need albuterol before activity) * Albuterol use two time or less a week on average (not counting use with activity) * Cough interfering with sleep two time or less a month * Oral steroids no more than once a year * No hospitalizations  2. Chronic non-allergic rhinitis - Continue with: Zyrtec (cetirizine) 10mg  tablet once daily as needed   3. Return in about 1 year (around 02/22/2023).    Please inform us of any Emergency Department visits, hospitalizations, or changes in symptoms. Call us before going to the ED for breathing or allergy symptoms since we might be able to fit you in for a sick visit. Feel free to contact us anytime with any questions, problems, or concerns.  It was a pleasure to see you again today!  Websites that have reliable patient information: 1. American Academy of Asthma, Allergy, and Immunology: www.aaaai.org 2. Food Allergy Research and Education (FARE): foodallergy.org 3. Mothers of Asthmatics: http://www.asthmacommunitynetwork.org 4. American College of Allergy, Asthma, and Immunology: www.acaai.org   COVID-19 Vaccine Information can be found at: ShippingScam.co.uk For questions related to vaccine distribution or appointments, please email [email or web address] or call [phone].   We realize that you might be concerned about having an allergic reaction to the COVID19 vaccines.  To help with that concern, WE ARE OFFERING THE COVID19 VACCINES IN OUR OFFICE! Ask the front desk for dates!     "Like" [place] on Facebook and Instagram for our latest updates!      A healthy democracy works best when [employer] participate! Make sure you are registered to vote! If you have moved or changed any of your contact information, you will need to get this updated before voting!  In some cases, you MAY be able to register to vote online: [email or web address]

## 2022-02-21 NOTE — Progress Notes (Signed)
    SUBJECTIVE:   CHIEF COMPLAINT / HPI:   Nicole Mcneil is a 21 y.o. female who presents to the [facility] clinic today to follow-up on a right breast mass.  She was previously seen in clinic 9/19 by Dr. Precious Haws for an asymptomatic breast lump at the 1 o'clock position.  At that time, it was tender to palpation without erythema, swelling, discharge, or drainage overlying the site.  No nipple changes.  She presents today for interval follow-up.  She says that since her last appointment, the lump has decreased in size.  No longer tender to palpation.  She has not noticed any new erythema, swelling, drainage, or nipple discharge.  No lymphadenopathy, fevers, chills, or weight changes.   PERTINENT  PMH / PSH: Migraine without aura, GAD, allergy to peanuts, Nexplanon in place   OBJECTIVE:   BP 100/70   Pulse 97   Ht 5\' 2"  (1.575 m)   Wt 207 lb (93.9 kg)   LMP 02/07/2022   SpO2 98%   BMI 37.86 kg/m    PHQ-9:     02/21/2022    3:46 PM 02/05/2022    2:55 PM 10/02/2021    4:20 PM  Depression screen PHQ 2/9  Decreased Interest 1 1 1   Down, Depressed, Hopeless 0 1 0  PHQ - 2 Score 1 2 1   Altered sleeping 1 1 3   Tired, decreased energy 1 1 2   Change in appetite 1 1 2   Feeling bad or failure about yourself  0 0 0  Trouble concentrating 1 1 2   Moving slowly or fidgety/restless 1 2 2   Suicidal thoughts 0 0 0  PHQ-9 Score 6 8 12   Difficult doing work/chores  Somewhat difficult Somewhat difficult    Physical Exam General: Awake, alert, oriented Respiratory: Breathing comfortably on room air, speaking full sentences without distress Breast exam: Right breast with palpable mobile nodule superior to areola at the 11 to 12 o'clock position.  No overlying erythema, warmth, swelling.  No nipple discharge.  ASSESSMENT/PLAN:   Breast lump Per patient, lump has decreased in size.  She has not developed any red flag symptoms since her last visit.  Given this, no mammogram ultrasound warranted at  this time, this is likely fibrocystic change in young healthy female.  Advised her to keep an eye on this and if there are any changes to return for imaging and follow-up.  Routine screening for STI (sexually transmitted infection) Patient amenable to routine hep C and HIV screenings today.  We will collect blood sample.  Need for HPV vaccine Chart indicates she is not yet been vaccinated for HPV.  NCIR congruent.  Patient amenable to initiating vaccine series, will start with first administration today.  Patient to return at 6 months and 12 months to complete series.     Ezequiel Essex, MD [place]
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
## 2022-02-21 NOTE — Patient Instructions (Addendum)
It was wonderful to see you today. Thank you for allowing me to be a part of your care. Below is a short summary of what we discussed at your visit today:  Breast lump Great news!  You do not need an ultrasound or mammogram at this time.  The lump has gotten smaller.  Come back if you develop worsened lump, change in size or shape, or redness/swelling/discharge over the site.  Ankle sprain Keep doing what you are doing with the rest, elevation, support, and ice.  Sometimes these sprains can take weeks to resolve, even longer than a broken ankle.  If you believe you have reinjured it or have difficulty walking, please come back for reevaluation.  Routine screenings For patients your age, we recommend screening for hepatitis C and HIV.  We can collect this blood samples today and have a result for you on your MyChart in about 2 to 3 days. If the results are normal, I will send you a letter or MyChart message. If the results are abnormal, I will give you a call.    Vaccines Today you declined the annual flu vaccine.  If you change your mind at any time, you may simply call the clinic and make a vaccine only appointment with a nurse at your convenience.  We recommend getting your flu shot every year.  While it does not fully prevent you from getting the flu, it does reduce symptom severity and keep you out of the hospital.  Today we gave you the HPV vaccine.  This is the first vaccine and a 3 shot series.  You will need to come back for the next shots at the 54-month and 82-month mark.  You can call our front desk to get the scheduled as a "nurse only vaccine visit".   Please bring all of your medications to every appointment!  If you have any questions or concerns, please do not hesitate to contact us via phone or MyChart message.   Ezequiel Essex, MD

## 2022-02-22 DIAGNOSIS — Z23 Encounter for immunization: Secondary | ICD-10-CM | POA: Insufficient documentation

## 2022-02-22 DIAGNOSIS — Z113 Encounter for screening for infections with a predominantly sexual mode of transmission: Secondary | ICD-10-CM

## 2022-02-22 HISTORY — DX: Encounter for screening for infections with a predominantly sexual mode of transmission: Z11.3

## 2022-02-22 HISTORY — DX: Encounter for immunization: Z23

## 2022-02-22 LAB — HEPATITIS C ANTIBODY: Hep C Virus Ab: NONREACTIVE

## 2022-02-22 LAB — HIV ANTIBODY (ROUTINE TESTING W REFLEX): HIV Screen 4th Generation wRfx: NONREACTIVE

## 2022-02-22 NOTE — Assessment & Plan Note (Signed)
Patient amenable to routine hep C and HIV screenings today.  We will collect blood sample.

## 2022-02-22 NOTE — Assessment & Plan Note (Signed)
Per patient, lump has decreased in size.  She has not developed any red flag symptoms since her last visit.  Given this, no mammogram ultrasound warranted at this time, this is likely fibrocystic change in young healthy female.  Advised her to keep an eye on this and if there are any changes to return for imaging and follow-up.

## 2022-02-22 NOTE — Assessment & Plan Note (Signed)
Chart indicates she is not yet been vaccinated for HPV.  NCIR congruent.  Patient amenable to initiating vaccine series, will start with first administration today.  Patient to return at 6 months and 12 months to complete series.

## 2022-02-24 ENCOUNTER — Encounter: Payer: Self-pay | Admitting: Allergy & Immunology

## 2022-03-20 ENCOUNTER — Emergency Department (HOSPITAL_BASED_OUTPATIENT_CLINIC_OR_DEPARTMENT_OTHER)
Admission: EM | Admit: 2022-03-20 | Discharge: 2022-03-20 | Disposition: A | Discharge status: Home / Self Care | Payer: BLUE CROSS/BLUE SHIELD | Attending: Emergency Medicine | Admitting: Emergency Medicine

## 2022-03-20 ENCOUNTER — Other Ambulatory Visit: Payer: Self-pay

## 2022-03-20 ENCOUNTER — Encounter (HOSPITAL_BASED_OUTPATIENT_CLINIC_OR_DEPARTMENT_OTHER): Payer: Self-pay | Admitting: Emergency Medicine

## 2022-03-20 DIAGNOSIS — J45909 Unspecified asthma, uncomplicated: Secondary | ICD-10-CM | POA: Insufficient documentation

## 2022-03-20 DIAGNOSIS — Z7951 Long term (current) use of inhaled steroids: Secondary | ICD-10-CM | POA: Insufficient documentation

## 2022-03-20 DIAGNOSIS — R42 Dizziness and giddiness: Secondary | ICD-10-CM | POA: Diagnosis present

## 2022-03-20 DIAGNOSIS — Z7982 Long term (current) use of aspirin: Secondary | ICD-10-CM | POA: Insufficient documentation

## 2022-03-20 DIAGNOSIS — D72829 Elevated white blood cell count, unspecified: Secondary | ICD-10-CM | POA: Insufficient documentation

## 2022-03-20 LAB — CBC WITH DIFFERENTIAL/PLATELET
Abs Immature Granulocytes: 0.04 10*3/uL (ref 0.00–0.07)
Basophils Absolute: 0 10*3/uL (ref 0.0–0.1)
Basophils Relative: 0 %
Eosinophils Absolute: 0.1 10*3/uL (ref 0.0–0.5)
Eosinophils Relative: 1 %
HCT: 39.2 % (ref 36.0–46.0)
Hemoglobin: 13.9 g/dL (ref 12.0–15.0)
Immature Granulocytes: 0 %
Lymphocytes Relative: 28 %
Lymphs Abs: 3.1 10*3/uL (ref 0.7–4.0)
MCH: 30.1 pg (ref 26.0–34.0)
MCHC: 35.5 g/dL (ref 30.0–36.0)
MCV: 84.8 fL (ref 80.0–100.0)
Monocytes Absolute: 0.8 10*3/uL (ref 0.1–1.0)
Monocytes Relative: 7 %
Neutro Abs: 6.9 10*3/uL (ref 1.7–7.7)
Neutrophils Relative %: 64 %
Platelets: 277 10*3/uL (ref 150–400)
RBC: 4.62 MIL/uL (ref 3.87–5.11)
RDW: 12.2 % (ref 11.5–15.5)
WBC: 10.9 10*3/uL — ABNORMAL HIGH (ref 4.0–10.5)
nRBC: 0 % (ref 0.0–0.2)

## 2022-03-20 LAB — BASIC METABOLIC PANEL
Anion gap: 8 (ref 5–15)
BUN: 9 mg/dL (ref 6–20)
CO2: 25 mmol/L (ref 22–32)
Calcium: 9.5 mg/dL (ref 8.9–10.3)
Chloride: 106 mmol/L (ref 98–111)
Creatinine, Ser: 0.69 mg/dL (ref 0.44–1.00)
GFR, Estimated: >60 mL/min (ref >60)
Glucose, Bld: 92 mg/dL (ref 70–99)
Potassium: 3.6 mmol/L (ref 3.5–5.1)
Sodium: 139 mmol/L (ref 135–145)

## 2022-03-20 LAB — HCG, SERUM, QUALITATIVE: Preg, Serum: NEGATIVE

## 2022-03-20 NOTE — ED Notes (Signed)
Pt agreeable with d/c plan as discussed by provider- this nurse has verbally reinforced d/c instructions and provided pt with written copy - pt acknowledges verbal understanding and denies any addl questions concerns needs - pt ambulatory independently at d/c with steady gait; vitals stable; no distress  

## 2022-03-20 NOTE — ED Triage Notes (Signed)
Slight headache now,  Reports episodes of dizziness and nose bleed. Started Friday.

## 2022-03-20 NOTE — ED Notes (Addendum)
Pt awake and alert sitting up in bed with visitor at bedside.  No obvious distress noted.  GCS 15.  Speech clear.  Pt denies active dizziness - does report episode of weakness and states at the time she saw a bp of 82/60 on the monitor -- denies weakness at the time - does state intermittent HA has been going on for months, however has worsened recently, and has been associated with light and noise sensitivity, dizziness, nausea, weakness, difficulty focusing related to vision and nosebleeds.  No gait/mobility issues reported and no c/o paresthesias.  Pt now on continuous cardiac and pulse ox monitoring - awaits lab results.  No immediate needs, questions, concerns verbalized - will monitor for acute changes and maintain plan of care.

## 2022-03-20 NOTE — Discharge Instructions (Addendum)
Please read and follow all provided instructions.  Your diagnoses today include:  1. Lightheadedness     Tests performed today include: Complete blood cell count: slightly elevated white blood count Basic metabolic panel: normal Pregnancy test (urine or blood, in women only): negative EKG: no abnormal heart rhythms Vital signs. See below for your results today.   Medications prescribed:  None  Take any prescribed medications only as directed.  Home care instructions:  Follow any educational materials contained in this packet.  BE VERY CAREFUL not to take multiple medicines containing Tylenol (also called acetaminophen). Doing so can lead to an overdose which can damage your liver and cause liver failure and possibly death.   Follow-up instructions: Please follow-up with your primary care provider in the next 3 days for further evaluation of your symptoms.   Return instructions:  Please return to the Emergency Department if you experience worsening symptoms.  Please return if you have any other emergent concerns.  Additional Information:  Your vital signs today were: BP 106/75   Pulse 83   Temp 98.5 F (36.9 C)   Resp 16   Ht 5\' 2"  (1.575 m)   Wt 90.7 kg   LMP 02/07/2022   SpO2 98%   BMI 36.58 kg/m  If your blood pressure (BP) was elevated above 135/85 this visit, please have this repeated by your doctor within one month. --------------

## 2022-03-20 NOTE — ED Provider Notes (Signed)
[facility] EMERGENCY DEPT Provider Note   CSN: [id number] Arrival date & time: 03/20/22  1804     History  Chief Complaint  Patient presents with   Headache    Nicole Mcneil is a 21 y.o. female.  Patient with no significant past medical history other than asthma presents to the emergency department today for evaluation of lightheadedness.  She has had lightheaded episodes over the past 5 days.  No full syncope.  No associated chest pain, palpitations or shortness of breath.  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  She does have worsening symptoms with position.  Denies nausea, vomiting, diarrhea.  She has had some minor nosebleeds, currently resolved, since that time.  She denies other easy bruising or bleeding including of the skin, in the urine or vaginal bleeding.  She uses implantable birth control which causes irregular periods.      Home Medications Prior to Admission medications   Medication Sig Start Date End Date Taking? Authorizing Provider  albuterol (VENTOLIN HFA) 108 (90 Base) MCG/ACT inhaler Inhale 2 puffs into the lungs every 6 (six) hours as needed for wheezing or shortness of breath. 08/21/21   Alfonse Spruce, MD  aspirin-acetaminophen-caffeine (EXCEDRIN MIGRAINE) [phone] MG tablet Take by mouth every 6 (six) hours as needed.    [provider]  fluticasone (FLOVENT HFA) 110 MCG/ACT inhaler Inhale 2 puffs into the lungs in the morning and at bedtime. Add during respiratory flares. 08/21/21   Alfonse Spruce, MD  ibuprofen (ADVIL) 600 MG tablet Take 1 tablet (600 mg total) by mouth every 6 (six) hours as needed. 02/11/22   Rancour, Jeannett Senior, MD  SUMAtriptan (IMITREX) 50 MG tablet Take one tablet at onset of migraine. May repeat in 2 hours if headache persists or recurs. Do not take more than 2 tablets in a day. 09/24/21   Fayette Pho, MD      Allergies    Patient has no known allergies.    Review  of Systems   Review of Systems  Physical Exam Updated Vital Signs BP 106/75   Pulse 83   Temp 98.5 F (36.9 C)   Resp 16   Ht 5\' 2"  (1.575 m)   Wt 90.7 kg   LMP 02/07/2022   SpO2 98%   BMI 36.58 kg/m   Physical Exam Vitals and nursing note reviewed.  Constitutional:      General: She is not in acute distress.    Appearance: She is well-developed.  HENT:     Head: Normocephalic and atraumatic.     Right Ear: External ear normal.     Left Ear: External ear normal.     Nose: Nose normal.  Eyes:     Conjunctiva/sclera: Conjunctivae normal.  Cardiovascular:     Rate and Rhythm: Normal rate and regular rhythm.     Heart sounds: No murmur heard. Pulmonary:     Effort: No respiratory distress.     Breath sounds: No wheezing, rhonchi or rales.  Abdominal:     Palpations: Abdomen is soft.     Tenderness: There is no abdominal tenderness. There is no guarding or rebound.  Musculoskeletal:     Cervical back: Normal range of motion and neck supple.     Right lower leg: No edema.     Left lower leg: No edema.  Skin:    General: Skin is warm and dry.     Findings: No rash.  Neurological:  General: No focal deficit present.     Mental Status: She is alert. Mental status is at baseline.     Motor: No weakness.  Psychiatric:        Mood and Affect: Mood normal.    ED Results / Procedures / Treatments   Labs (all labs ordered are listed, but only abnormal results are displayed) Labs Reviewed  CBC WITH DIFFERENTIAL/PLATELET - Abnormal; Notable for the following components:      Result Value   WBC 10.9 (*)    All other components within normal limits  BASIC METABOLIC PANEL  HCG, SERUM, QUALITATIVE    ED ECG REPORT   Date: 03/20/2022  Rate: 70  Rhythm: normal sinus rhythm and premature atrial contractions (PAC)  QRS Axis: normal  Intervals: normal  ST/T Wave abnormalities: normal  Conduction Disutrbances:none  Narrative Interpretation:   Old EKG Reviewed:  unchanged except for new ectopy  I have personally reviewed the EKG tracing and agree with the computerized printout as noted.   Radiology No results found.  Procedures Procedures    Medications Ordered in ED Medications - No data to display  ED Course/ Medical Decision Making/ A&P    Patient seen and examined. History obtained directly from patient.  Given reported bleeding and lightheadedness symptoms will obtain work-up.  Labs/EKG: Ordered CBC, BMP and pregnancy.  Also ordered EKG.  Imaging: None ordered  Medications/Fluids: None ordered  Most recent vital signs reviewed and are as follows: BP 106/75   Pulse 83   Temp 98.5 F (36.9 C)   Resp 16   Ht 5\' 2"  (1.575 m)   Wt 90.7 kg   LMP 02/07/2022   SpO2 98%   BMI 36.58 kg/m   Initial impression: Patient with lightheadedness and intermittent nosebleed, currently resolved.  Low concern for blood loss anemia.  We will make sure no signs of chronic anemia, electrolyte abnormality, acute knee injury.  Will ensure no signs of arrhythmia or other concerning findings on EKG.  She looks well.  If work-up is reassuring, anticipate discharge to home with outpatient follow-up.  9:08 PM Reassessment performed. Patient appears stable, comfortable.  Labs personally reviewed and interpreted including: CBC with elevated white blood cell count and normal hemoglobin; BMP normal: Pregnancy negative.  EKG is normal.  Reviewed pertinent lab work and imaging with patient at bedside. Questions answered.   Most current vital signs reviewed and are as follows: BP 126/81   Pulse 69   Temp 98.5 F (36.9 C)   Resp 17   Ht 5\' 2"  (1.575 m)   Wt 90.7 kg   LMP 02/07/2022   SpO2 100%   BMI 36.58 kg/m   Plan: Discharge to home.   Prescriptions written for: None  Other home care instructions discussed: Rest, hydration  ED return instructions discussed: Return with headache, chest pain, shortness of breath, full syncope  Follow-up  instructions discussed: Patient encouraged to follow-up with their PCP in 3 days.                            Medical Decision Making Amount and/or Complexity of Data Reviewed Labs: ordered.   Patient with vague lightheadedness without syncope.  No associated chest pain or shortness of breath.  She is PERC negative.  Low concern for PE or.  She is not pregnant.  EKG without signs of prolonged QT, WPW, Brugada syndrome, heart block, arrhythmia.  No sign of anemia or electrolyte derangement  on lab work.  She looks well without any concerning features on exam today.  The patient's vital signs, pertinent lab work and imaging were reviewed and interpreted as discussed in the ED course. Hospitalization was considered for further testing, treatments, or serial exams/observation. However as patient is well-appearing, has a stable exam, and reassuring studies today, I do not feel that they warrant admission at this time. This plan was discussed with the patient who verbalizes agreement and comfort with this plan and seems reliable and able to return to the Emergency Department with worsening or changing symptoms.          Final Clinical Impression(s) / ED Diagnoses Final diagnoses:  Lightheadedness    Rx / DC Orders ED Discharge Orders     None         Renne Crigler, PA-C 03/20/22 2111    Tanda Rockers A, DO 03/22/22 1001

## 2022-06-15 ENCOUNTER — Encounter (HOSPITAL_BASED_OUTPATIENT_CLINIC_OR_DEPARTMENT_OTHER): Payer: Self-pay

## 2022-06-15 ENCOUNTER — Other Ambulatory Visit: Payer: Self-pay

## 2022-06-15 ENCOUNTER — Emergency Department (HOSPITAL_BASED_OUTPATIENT_CLINIC_OR_DEPARTMENT_OTHER)
Admission: EM | Admit: 2022-06-15 | Discharge: 2022-06-15 | Disposition: A | Discharge status: Home / Self Care | Payer: BLUE CROSS/BLUE SHIELD | Attending: Emergency Medicine | Admitting: Emergency Medicine

## 2022-06-15 DIAGNOSIS — J4521 Mild intermittent asthma with (acute) exacerbation: Secondary | ICD-10-CM | POA: Insufficient documentation

## 2022-06-15 DIAGNOSIS — Z1152 Encounter for screening for COVID-19: Secondary | ICD-10-CM | POA: Insufficient documentation

## 2022-06-15 DIAGNOSIS — R0602 Shortness of breath: Secondary | ICD-10-CM | POA: Diagnosis present

## 2022-06-15 LAB — RESP PANEL BY RT-PCR (RSV, FLU A&B, COVID)  RVPGX2
Influenza A by PCR: NEGATIVE
Influenza B by PCR: NEGATIVE
Resp Syncytial Virus by PCR: NEGATIVE
SARS Coronavirus 2 by RT PCR: NEGATIVE

## 2022-06-15 MED ORDER — ALBUTEROL SULFATE HFA 108 (90 BASE) MCG/ACT IN AERS
2.0000 | INHALATION_SPRAY | RESPIRATORY_TRACT | Status: DC | PRN
  Administered 2022-06-15: 2 via RESPIRATORY_TRACT
  Filled 2022-06-15: qty 6.7

## 2022-06-15 MED ORDER — PREDNISONE 20 MG PO TABS: ORAL_TABLET | ORAL | 0 refills | Status: DC

## 2022-06-15 NOTE — ED Triage Notes (Signed)
Shortness of breath since yesterday.  States inhaler is not working.

## 2022-06-15 NOTE — ED Provider Notes (Signed)
[facility] Provider Note   CSN: [id number] Arrival date & time: 06/15/22  1926     History  Chief Complaint  Patient presents with   Shortness of Breath    Annlee Glandon is a 22 y.o. female.  The history is provided by the patient and medical records. No language interpreter was used.  Shortness of Breath    22 year old female with significant history of asthma presenting with complaints of shortness of breath.  Patient reports since yesterday she has had trouble with shortness of breath and wheezing that felt similar to prior asthma exacerbation.  She used inhaler twice yesterday and once today and in did not notice any significant improvement prompting this ER visit.  She did blow her nose a few times but otherwise she denies any fever chills sore throat chest pain abdominal pain or increased productive cough.  No prior history of PE or DVT no recent surgery prolonged bedrest active cancer calf pain or hemoptysis.  She mention her asthma is usually fairly well-controlled but due to recent weather change has been a bit more difficult to manage.  Since being in the ED she felt her symptoms did improve as she did use her inhaler prior to coming here.  Home Medications Prior to Admission medications   Medication Sig Start Date End Date Taking? Authorizing Provider  albuterol (VENTOLIN HFA) 108 (90 Base) MCG/ACT inhaler Inhale 2 puffs into the lungs every 6 (six) hours as needed for wheezing or shortness of breath. 08/21/21   Valentina Shaggy, MD  aspirin-acetaminophen-caffeine (EXCEDRIN MIGRAINE) [phone] MG tablet Take by mouth every 6 (six) hours as needed.    [provider]  fluticasone (FLOVENT HFA) 110 MCG/ACT inhaler Inhale 2 puffs into the lungs in the morning and at bedtime. Add during respiratory flares. 08/21/21   Valentina Shaggy, MD  ibuprofen (ADVIL) 600 MG tablet Take 1 tablet (600 mg total) by mouth every 6  (six) hours as needed. 02/11/22   Rancour, Annie Main, MD  SUMAtriptan (IMITREX) 50 MG tablet Take one tablet at onset of migraine. May repeat in 2 hours if headache persists or recurs. Do not take more than 2 tablets in a day. 09/24/21   Ezequiel Essex, MD      Allergies    Patient has no known allergies.    Review of Systems   Review of Systems  Respiratory:  Positive for shortness of breath.   All other systems reviewed and are negative.   Physical Exam Updated Vital Signs BP 128/89 (BP Location: Right Arm)   Pulse 99   Temp 98.7 F (37.1 C)   Resp 18   Ht 5\' 2"  (1.575 m)   Wt 90.7 kg   SpO2 99%   BMI 36.58 kg/m  Physical Exam Vitals and nursing note reviewed.  Constitutional:      General: She is not in acute distress.    Appearance: She is well-developed.  HENT:     Head: Atraumatic.  Eyes:     Conjunctiva/sclera: Conjunctivae normal.  Cardiovascular:     Rate and Rhythm: Normal rate and regular rhythm.  Pulmonary:     Effort: Pulmonary effort is normal.     Breath sounds: Wheezing present. No decreased breath sounds, rhonchi or rales.  Chest:     Chest wall: No tenderness.  Musculoskeletal:     Cervical back: Neck supple.  Skin:    Findings: No rash.  Neurological:     Mental Status:  She is alert.  Psychiatric:        Mood and Affect: Mood normal.     ED Results / Procedures / Treatments   Labs (all labs ordered are listed, but only abnormal results are displayed) Labs Reviewed  RESP PANEL BY RT-PCR (RSV, FLU A&B, COVID)  RVPGX2    EKG None  Radiology No results found.  Procedures Procedures    Medications Ordered in ED Medications  albuterol (VENTOLIN HFA) 108 (90 Base) MCG/ACT inhaler 2 puff (2 puffs Inhalation Given 06/15/22 2117)    ED Course/ Medical Decision Making/ A&P                             Medical Decision Making  BP 128/89 (BP Location: Right Arm)   Pulse 99   Temp 98.7 F (37.1 C)   Resp 18   Ht 5\' 2"  (1.575 m)   Wt  90.7 kg   SpO2 99%   BMI 36.58 kg/m   88:15 PM  22 year old female with significant history of asthma presenting with complaints of shortness of breath.  Patient reports since yesterday she has had trouble with shortness of breath and wheezing that felt similar to prior asthma exacerbation.  She used inhaler twice yesterday and once today and in did not notice any significant improvement prompting this ER visit.  She did blow her nose a few times but otherwise she denies any fever chills sore throat chest pain abdominal pain or increased productive cough.  No prior history of PE or DVT no recent surgery prolonged bedrest active cancer calf pain or hemoptysis.  She mention her asthma is usually fairly well-controlled but due to recent weather change has been a bit more difficult to manage.  Since being in the ED she felt her symptoms did improve as she did use her inhaler prior to coming here.  On exam patient is sitting in the chair comfortably appears to be in no acute discomfort.  She speaks in complete sentences.  She does have some mildly diminished lung sounds but otherwise no overt rales or rhonchi.  Vital signs reviewed and are unremarkable no hypoxia.  Patient is PERC negative doubt PE.  Labs obtained including negative COVID flu and RSV.  Chest x-ray considered but felt her symptoms likely asthma exacerbation and less likely to be pneumonia.  After discussion, will provide patient with a rescue inhaler and will discharge home with a course of steroid.  Return precaution given.  I have considered PE, pneumonia,  viral illness as part of my differential.        Final Clinical Impression(s) / ED Diagnoses Final diagnoses:  Mild intermittent asthma with exacerbation    Rx / DC Orders ED Discharge Orders          Ordered    predniSONE (DELTASONE) 20 MG tablet        06/15/22 2119              Domenic Moras, PA-C 06/15/22 2120    Tegeler, Gwenyth Allegra, MD 06/15/22 2153

## 2022-06-15 NOTE — ED Notes (Signed)
RT assessed pt for SOB. Pt BLBS clear throughout all lung fields, no wheezing/tightness noted upon auscultation, oxygenation and ventilation WNL. Pt states she gets SOB after she has a "coughing spell" and takes inhaler but does not help. Pt respiratory status is stable on RA w/no distress noted at this time.

## 2022-07-04 ENCOUNTER — Other Ambulatory Visit: Payer: Self-pay

## 2022-07-04 ENCOUNTER — Emergency Department (HOSPITAL_BASED_OUTPATIENT_CLINIC_OR_DEPARTMENT_OTHER)
Admission: EM | Admit: 2022-07-04 | Discharge: 2022-07-04 | Disposition: A | Discharge status: Home / Self Care | Payer: BLUE CROSS/BLUE SHIELD | Attending: Emergency Medicine | Admitting: Emergency Medicine

## 2022-07-04 ENCOUNTER — Encounter (HOSPITAL_BASED_OUTPATIENT_CLINIC_OR_DEPARTMENT_OTHER): Payer: Self-pay

## 2022-07-04 DIAGNOSIS — J020 Streptococcal pharyngitis: Secondary | ICD-10-CM

## 2022-07-04 DIAGNOSIS — Z20822 Contact with and (suspected) exposure to covid-19: Secondary | ICD-10-CM | POA: Insufficient documentation

## 2022-07-04 DIAGNOSIS — J029 Acute pharyngitis, unspecified: Secondary | ICD-10-CM | POA: Diagnosis present

## 2022-07-04 LAB — GROUP A STREP BY PCR: Group A Strep by PCR: DETECTED — AB

## 2022-07-04 LAB — RESP PANEL BY RT-PCR (RSV, FLU A&B, COVID)  RVPGX2
Influenza A by PCR: NEGATIVE
Influenza B by PCR: NEGATIVE
Resp Syncytial Virus by PCR: NEGATIVE
SARS Coronavirus 2 by RT PCR: NEGATIVE

## 2022-07-04 MED ORDER — AMOXICILLIN 500 MG PO CAPS: 1000.0000 mg | ORAL_CAPSULE | Freq: Every day | ORAL | 0 refills | Status: AC

## 2022-07-04 MED ORDER — DEXAMETHASONE 4 MG PO TABS
10.0000 mg | ORAL_TABLET | Freq: Once | ORAL | Status: AC
  Administered 2022-07-04: 10 mg via ORAL
  Filled 2022-07-04: qty 3

## 2022-07-04 NOTE — ED Triage Notes (Signed)
Patient here POV from Home.  Endorses Sore Throat that began 4 Days ago. Some Cough, Productive at Times.  No Known Fevers.  NAD Noted during Triage. A&Ox4. GCS 15. Ambulatory.

## 2022-07-04 NOTE — ED Provider Notes (Signed)
[facility] Provider Note   CSN: [id number] Arrival date & time: 07/04/22  1743     History  Chief Complaint  Patient presents with   Sore Throat    Nicole Mcneil is a 22 y.o. female.  22 year old female who presents emergency department with sore throat.  Says that her symptoms started on Tuesday and has been trying over-the-counter Coricidin without improvement of her symptoms.  Says that the back of her throat hurts and her ears hurt as well.  No fevers.  Also has some congestion with mild cough.  Still tolerating her secretions and able to eat.  Says that her voice sounds a little bit hoarse but no other changes.       Home Medications Prior to Admission medications   Medication Sig Start Date End Date Taking? Authorizing Provider  amoxicillin (AMOXIL) 500 MG capsule Take 2 capsules (1,000 mg total) by mouth daily for 10 days. 07/04/22 07/14/22 Yes Fransico Meadow, MD  albuterol (VENTOLIN HFA) 108 (90 Base) MCG/ACT inhaler Inhale 2 puffs into the lungs every 6 (six) hours as needed for wheezing or shortness of breath. 08/21/21   Valentina Shaggy, MD  aspirin-acetaminophen-caffeine (EXCEDRIN MIGRAINE) [phone] MG tablet Take by mouth every 6 (six) hours as needed.    [provider]  fluticasone (FLOVENT HFA) 110 MCG/ACT inhaler Inhale 2 puffs into the lungs in the morning and at bedtime. Add during respiratory flares. 08/21/21   Valentina Shaggy, MD  ibuprofen (ADVIL) 600 MG tablet Take 1 tablet (600 mg total) by mouth every 6 (six) hours as needed. 02/11/22   Rancour, Annie Main, MD  predniSONE (DELTASONE) 20 MG tablet 3 tabs po day one, then 2 tabs daily x 4 days 06/15/22   Domenic Moras, PA-C  SUMAtriptan (IMITREX) 50 MG tablet Take one tablet at onset of migraine. May repeat in 2 hours if headache persists or recurs. Do not take more than 2 tablets in a day. 09/24/21   Ezequiel Essex, MD      Allergies    Patient  has no known allergies.    Review of Systems   Review of Systems  Physical Exam Updated Vital Signs BP 120/85 (BP Location: Right Arm)   Pulse 89   Temp 98.4 F (36.9 C) (Oral)   Resp 16   Ht 5' 2"$  (1.575 m)   Wt 90.7 kg   SpO2 99%   BMI 36.58 kg/m  Physical Exam Constitutional:      Appearance: She is normal weight.  HENT:     Head: Normocephalic and atraumatic.     Right Ear: Tympanic membrane and ear canal normal.     Left Ear: Tympanic membrane and ear canal normal.     Mouth/Throat:     Mouth: Mucous membranes are moist.     Pharynx: Posterior oropharyngeal erythema present. No oropharyngeal exudate or uvula swelling.  Pulmonary:     Effort: Pulmonary effort is normal.     Breath sounds: Normal breath sounds.  Neurological:     Mental Status: She is alert.     ED Results / Procedures / Treatments   Labs (all labs ordered are listed, but only abnormal results are displayed) Labs Reviewed  GROUP A STREP BY PCR - Abnormal; Notable for the following components:      Result Value   Group A Strep by PCR DETECTED (*)    All other components within normal limits  RESP PANEL BY RT-PCR (RSV,  FLU A&B, COVID)  RVPGX2    EKG None  Radiology No results found.  Procedures Procedures   Medications Ordered in ED Medications  dexamethasone (DECADRON) tablet 10 mg (10 mg Oral Given 07/04/22 2042)    ED Course/ Medical Decision Making/ A&P                             Medical Decision Making Risk Prescription drug management.   Nicole Mcneil is a 22 y.o. female who presents to the emergency department with sore throat   Initial Ddx:  Viral pharyngitis, strep pharyngitis, URI  MDM:  Feel that patient likely has viral pharyngitis or strep pharyngitis.  No signs of other complication such as peritonsillar abscess.  Will obtain COVID and flu swabs.  Will also obtain a strep swab  Plan:  COVID/flu Rapid strep  ED Summary/Re-evaluation:  Patient found to  have group A strep.  Given dose of Decadron in the emergency department for swelling.  Sent home with amoxicillin and instructions to follow-up with his primary doctor.  This patient presents to the ED for concern of complaints listed in HPI, this involves an extensive number of treatment options, and is a complaint that carries with it a high risk of complications and morbidity. Disposition including potential need for admission considered.   Dispo: DC Home. Return precautions discussed including, but not limited to, those listed in the AVS. Allowed pt time to ask questions which were answered fully prior to dc.  Records reviewed Outpatient Clinic Notes I have reviewed the patients home medications and made adjustments as needed  Final Clinical Impression(s) / ED Diagnoses Final diagnoses:  Strep throat    Rx / DC Orders ED Discharge Orders          Ordered    amoxicillin (AMOXIL) 500 MG capsule  Daily        07/04/22 2022              Fransico Meadow, MD 07/05/22 0104

## 2022-07-04 NOTE — Discharge Instructions (Addendum)
You were seen for strep throat in the emergency department.   At home, please take Tylenol and ibuprofen for your pain and the antibiotics for the infection.    Check your MyChart online for the results of any tests that had not resulted by the time you left the emergency department.   Follow-up with your primary doctor in 2-3 days regarding your visit.  If you do not have a primary care doctor you may follow-up with [facility] which is listed in this packet.  Return immediately to the emergency department if you experience any of the following: Difficulty breathing, or any other concerning symptoms.    Thank you for visiting our Emergency Department. It was a pleasure taking care of you today.

## 2022-08-24 ENCOUNTER — Other Ambulatory Visit: Payer: Self-pay

## 2022-08-24 ENCOUNTER — Emergency Department (HOSPITAL_BASED_OUTPATIENT_CLINIC_OR_DEPARTMENT_OTHER)
Admission: EM | Admit: 2022-08-24 | Discharge: 2022-08-24 | Disposition: A | Discharge status: Home / Self Care | Payer: BLUE CROSS/BLUE SHIELD | Attending: Emergency Medicine | Admitting: Emergency Medicine

## 2022-08-24 DIAGNOSIS — W4904XA Ring or other jewelry causing external constriction, initial encounter: Secondary | ICD-10-CM | POA: Diagnosis not present

## 2022-08-24 DIAGNOSIS — S00452A Superficial foreign body of left ear, initial encounter: Secondary | ICD-10-CM | POA: Insufficient documentation

## 2022-08-24 MED ORDER — ACETAMINOPHEN 325 MG PO TABS
650.0000 mg | ORAL_TABLET | Freq: Once | ORAL | Status: AC
  Administered 2022-08-24: 650 mg via ORAL
  Filled 2022-08-24: qty 2

## 2022-08-24 MED ORDER — LIDOCAINE-EPINEPHRINE-TETRACAINE (LET) TOPICAL GEL
3.0000 mL | Freq: Once | TOPICAL | Status: AC
  Administered 2022-08-24: 3 mL via TOPICAL
  Filled 2022-08-24: qty 3

## 2022-08-24 MED ORDER — IBUPROFEN 400 MG PO TABS
400.0000 mg | ORAL_TABLET | Freq: Once | ORAL | Status: AC
  Administered 2022-08-24: 400 mg via ORAL
  Filled 2022-08-24: qty 1

## 2022-08-24 NOTE — Discharge Instructions (Signed)
You were seen in the emergency department for your earring that was stuck in your earlobe.  We were able to successfully remove it for you in the emergency department.  You should let your earlobe fully heal before getting it repierced.  You can wash your ear with soap and water and apply antibiotic ointment to help prevent infection.  You should exchange your other earring for a larger stud or a hoop to prevent the same thing from happening on the other side.  You can follow-up with your primary doctor as needed to make sure that your earlobe is healing as expected.  You should return to the emergency department if you have significant redness or swelling to your earlobe, pus draining from your earlobe, you have fevers or if you have any other new or concerning symptoms.

## 2022-08-24 NOTE — ED Provider Notes (Signed)
[place] [facility]   CSN: [id number] Arrival date & time: 08/24/22  [id number]     History  Chief Complaint  Patient presents with   Ear Problem    Nicole Mcneil is a 22 y.o. female.  Patient is a 22 year old female with no significant past medical history presenting to the emergency department for earring stuck in her earlobe.  The patient states that she had her ears pierced about 1 month ago.  She states that when she woke up this morning her hearing had retracted in her left earlobe and she was not able to pull this out.  She has small studs in place.  She denies significant pain in low severe touching her earlobe or any drainage.  She states that she was told to keep her earrings in for about another month prior to changing out her earrings.  The history is provided by the patient.       Home Medications Prior to Admission medications   Medication Sig Start Date End Date Taking? Authorizing Provider  albuterol (VENTOLIN HFA) 108 (90 Base) MCG/ACT inhaler Inhale 2 puffs into the lungs every 6 (six) hours as needed for wheezing or shortness of breath. 08/21/21   Alfonse SpruceGallagher, Joel Louis, MD  aspirin-acetaminophen-caffeine (EXCEDRIN MIGRAINE) [phone] MG tablet Take by mouth every 6 (six) hours as needed.    [provider]  fluticasone (FLOVENT HFA) 110 MCG/ACT inhaler Inhale 2 puffs into the lungs in the morning and at bedtime. Add during respiratory flares. 08/21/21   Alfonse SpruceGallagher, Joel Louis, MD  ibuprofen (ADVIL) 600 MG tablet Take 1 tablet (600 mg total) by mouth every 6 (six) hours as needed. 02/11/22   Rancour, Jeannett SeniorStephen, MD  predniSONE (DELTASONE) 20 MG tablet 3 tabs po day one, then 2 tabs daily x 4 days 06/15/22   Fayrene Helperran, Bowie, PA-C  SUMAtriptan (IMITREX) 50 MG tablet Take one tablet at onset of migraine. May repeat in 2 hours if headache persists or recurs. Do not take more than 2 tablets in a day. 09/24/21   Fayette PhoLynn, Catherine, MD       Allergies    Patient has no known allergies.    Review of Systems   Review of Systems  Physical Exam Updated Vital Signs BP 121/79   Pulse 98   Temp 98.2 F (36.8 C) (Oral)   Resp 20   Ht 5\' 2"  (1.575 m)   Wt 90.7 kg   SpO2 97%   BMI 36.58 kg/m  Physical Exam Vitals and nursing note reviewed.  Constitutional:      General: She is not in acute distress.    Appearance: Normal appearance.  HENT:     Head: Normocephalic and atraumatic.     Ears:     Comments: Small stud in place in R earlobe Able to visualize back of earring of L earlobe, stud not visualized Small amount of dried blood of L earlobe where piercing is placed Able to palpate stud in L earlobe with mild tenderness to palpation, no surrounding erythema, warmth or purulent drainage    Nose: Nose normal.  Eyes:     Extraocular Movements: Extraocular movements intact.  Pulmonary:     Effort: Pulmonary effort is normal.  Musculoskeletal:        General: Normal range of motion.     Cervical back: Normal range of motion.  Skin:    General: Skin is warm and dry.  Neurological:     Mental Status: She  is alert and oriented to person, place, and time.  Psychiatric:        Mood and Affect: Mood normal.        Behavior: Behavior normal.     ED Results / Procedures / Treatments   Labs (all labs ordered are listed, but only abnormal results are displayed) Labs Reviewed - No data to display  EKG None  Radiology No results found.  Procedures .Foreign Body Removal  Date/Time: 08/24/2022 10:11 AM  Performed by: Rexford Maus, DO Authorized by: Rexford Maus, DO  Consent: Verbal consent obtained. Risks and benefits: risks, benefits and alternatives were discussed Consent given by: patient Patient identity confirmed: verbally with patient Body area: ear Location details: left ear  Anesthesia: Local Anesthetic: LET (lido,epi,tetracaine)  Sedation: Patient sedated: no  Patient  restrained: no Patient cooperative: yes Localization method: visualized Removal mechanism: forceps Complexity: simple 1 objects recovered. Objects recovered: earring Post-procedure assessment: foreign body removed Comments: Embedded earring removed from L earlobe       Medications Ordered in ED Medications  lidocaine-EPINEPHrine-tetracaine (LET) topical gel (3 mLs Topical Given 08/24/22 0956)  acetaminophen (TYLENOL) tablet 650 mg (650 mg Oral Given 08/24/22 0955)  ibuprofen (ADVIL) tablet 400 mg (400 mg Oral Given 08/24/22 0955)    ED Course/ Medical Decision Making/ A&P Clinical Course as of 08/24/22 1036  Sat Aug 24, 2022  1034 Earring successfully removed per procedure note. She is stable for discharge and was given strict return precautions. [VK]    Clinical Course User Index [VK] Rexford Maus, DO                             Medical Decision Making This patient presents to the ED with chief complaint(s) of imbedded earring with no pertinent past medical history which further complicates the presenting complaint. The complaint involves an extensive differential diagnosis and also carries with it a high risk of complications and morbidity.    The differential diagnosis includes embedded earring, no evidence of cellulitis or abscess on exam   Additional history obtained: Additional history obtained from N/A Records reviewed N/A  ED Course and Reassessment: Patient is well-appearing on arrival no acute distress.  She does have evidence of an embedded earring in the left earlobe, able to visualize posterior aspect of earring.  She will have let gel placed for anesthesia and was given Tylenol and Motrin for pain and will attempt earring removal.  Independent labs interpretation:  N/A  Independent visualization of imaging: N/A  Consultation: - Consulted or discussed management/test interpretation w/ external professional: N/A  Consideration for admission or further  workup: Patient has no emergent conditions requiring admission or further work-up at this time and is stable for discharge home with primary care follow-up  Social Determinants of health: N/A    Risk OTC drugs. Prescription drug management.          Final Clinical Impression(s) / ED Diagnoses Final diagnoses:  Embedded earring of left ear, initial encounter    Rx / DC Orders ED Discharge Orders     None         Rexford Maus, DO 08/24/22 1036

## 2022-08-24 NOTE — ED Triage Notes (Signed)
Patient arrives with complaints of earring being stuck in her left ear. Patient states that her ear was pierced about a month ago, but started having complications the past 2 days. Slept on the piercing overnight and woke up with swelling and unable to remove earring. Reports no pain unless sight is touched.

## 2022-09-30 ENCOUNTER — Ambulatory Visit: Payer: BLUE CROSS/BLUE SHIELD | Admitting: Family Medicine

## 2022-10-22 ENCOUNTER — Ambulatory Visit (INDEPENDENT_AMBULATORY_CARE_PROVIDER_SITE_OTHER): Payer: BLUE CROSS/BLUE SHIELD | Admitting: Family Medicine

## 2022-10-22 ENCOUNTER — Encounter: Payer: Self-pay | Admitting: Family Medicine

## 2022-10-22 VITALS — BP 111/78 | HR 109 | Ht 62.0 in | Wt 214.6 lb

## 2022-10-22 DIAGNOSIS — Z3046 Encounter for surveillance of implantable subdermal contraceptive: Secondary | ICD-10-CM

## 2022-10-22 DIAGNOSIS — Z3009 Encounter for other general counseling and advice on contraception: Secondary | ICD-10-CM

## 2022-10-22 NOTE — Assessment & Plan Note (Signed)
Removed previous Nexplanon (placed 11/04/2019). Patient within dates. Replaced with new Nexplanon. Tolerated procedure well. Next due for replacement or removal October 18, 2025.

## 2022-10-22 NOTE — Progress Notes (Signed)
    SUBJECTIVE:   CHIEF COMPLAINT / HPI:   Nexplanon removal and re-insertion Patient is 22 year old woman who presents today for Nexplanon removal and reinsertion. Current Nexplanon was placed 11/04/2019 Currently wirthin dates Irregular menses, unclear LMP  PERTINENT  PMH / PSH:  Patient Active Problem List   Diagnosis Date Noted   Need for HPV vaccine 02/22/2022   Routine screening for STI (sexually transmitted infection) 02/22/2022   Breast lump 02/05/2022   Migraine without aura and without status migrainosus, not intractable 09/24/2021   Generalized anxiety disorder 11/04/2019   Nexplanon in place 11/04/2019   PERSONAL HISTORY OF ALLERGY TO PEANUTS 12/11/2007    OBJECTIVE:   BP 111/78   Pulse (!) 109   Ht 5\' 2"  (1.575 m)   Wt 214 lb 9.6 oz (97.3 kg)   SpO2 100%   BMI 39.25 kg/m    PHQ-9:     02/21/2022    3:46 PM 02/05/2022    2:55 PM 10/02/2021    4:20 PM  Depression screen PHQ 2/9  Decreased Interest 1 1 1   Down, Depressed, Hopeless 0 1 0  PHQ - 2 Score 1 2 1   Altered sleeping 1 1 3   Tired, decreased energy 1 1 2   Change in appetite 1 1 2   Feeling bad or failure about yourself  0 0 0  Trouble concentrating 1 1 2   Moving slowly or fidgety/restless 1 2 2   Suicidal thoughts 0 0 0  PHQ-9 Score 6 8 12   Difficult doing work/chores  Somewhat difficult Somewhat difficult    Physical Exam General: Awake, alert, oriented, no acute distress Respiratory: Unlabored respirations, speaking in full sentences, no respiratory distress Extremities: Moving all extremities spontaneously Neuro: Cranial nerves II through X grossly intact Psych: Normal insight and judgement   PROCEDURE:   Nexplanon Removal and Insertion  Patient identified, informed consent performed, consent signed.   Patient does understand that irregular bleeding is a very common side effect of this medication. She was advised to have backup contraception for one week after replacement of the implant.    Patient is within dates for her current Nexplanon.  Pregnancy test in clinic today was not obtained.   Appropriate time out taken. Implanon site identified. Area prepped in usual sterile fashon. One ml of 1% lidocaine with epi was used to anesthetize the area at the distal end of the implant. A small stab incision was made right beside the implant on the distal portion. The Nexplanon rod was grasped using hemostats and removed without difficulty. There was minimal blood loss. There were no complications. Area was then injected with 3 ml of 1 % lidocaine. She was re-prepped with betadine, Nexplanon removed from packaging, Device confirmed in needle, then inserted full length of needle and withdrawn per handbook instructions. Nexplanon was able to palpated in the patient's arm. There was minimal blood loss. Patient insertion site covered with guaze and a pressure bandage to reduce any bruising. The patient tolerated the procedure well and was given post procedure instructions.  She was advised to have backup contraception for one week.    ASSESSMENT/PLAN:   Nexplanon in place Removed previous Nexplanon (placed 11/04/2019). Patient within dates. Replaced with new Nexplanon. Tolerated procedure well. Next due for replacement or removal October 18, 2025.      Fayette Pho, MD [facility]

## 2022-10-22 NOTE — Patient Instructions (Signed)
It was wonderful to see you today. Thank you for allowing me to be a part of your care. Below is a short summary of what we discussed at your visit today:  Nexplanon removal  Today we removed your Nexplanon implant and replaced it.  After care: Women may have discomfort and some bruising following the removal of Nexplanon. Wear your pressure bandage for a full 24 hours, then an adhesive bandage for 3-5 days. Allow the wound closure strips to fall off naturally with routine showering - do not remove forcefully.   Reasons to seek care: Fever and chills Swelling and redness of the Nexplanon site Abnormal or foul drainage from the Nexplanon removal site   Health Maintenance We like to think about ways to keep you healthy for years to come. Below are some interventions and screenings we can offer to keep you healthy: - PAP smear - Hpv vaccines  Primary care doctor graduating soon! I am graduating June 28th. You will be assigned a new PCP.  Thank you for letting me care for you! It has been wonderful getting to know you.    Please bring all of your medications to every appointment!  If you have any questions or concerns, please do not hesitate to contact us via phone or MyChart message.   Fayette Pho, MD

## 2022-10-30 ENCOUNTER — Encounter: Payer: Self-pay | Admitting: Family Medicine

## 2022-11-04 ENCOUNTER — Ambulatory Visit (INDEPENDENT_AMBULATORY_CARE_PROVIDER_SITE_OTHER): Payer: BLUE CROSS/BLUE SHIELD | Admitting: Family Medicine

## 2022-11-04 ENCOUNTER — Encounter: Payer: Self-pay | Admitting: Family Medicine

## 2022-11-04 VITALS — BP 124/69 | HR 65 | Wt 213.2 lb

## 2022-11-04 DIAGNOSIS — L299 Pruritus, unspecified: Secondary | ICD-10-CM

## 2022-11-04 DIAGNOSIS — Z3009 Encounter for other general counseling and advice on contraception: Secondary | ICD-10-CM | POA: Diagnosis not present

## 2022-11-04 MED ORDER — DIPHENHYDRAMINE-ZINC ACETATE 2-0.1 % EX CREA
1.0000 | TOPICAL_CREAM | Freq: Three times a day (TID) | CUTANEOUS | 0 refills | Status: DC | PRN
Start: 2022-11-04 — End: 2023-08-04

## 2022-11-04 NOTE — Assessment & Plan Note (Signed)
Acute, improving.  Local irritation and rash starting about a week after Nexplanon placement.  Physical exam reassuring, no signs of infection.  HPI reassuring against infection.  Discussed known complication of local irritation and rash versus allergic reaction to Nexplanon placement, although these are less likely in someone who has previously received the Nexplanon. - Rx Benadryl cream twice daily to 3 times daily - Second-generation antihistamine daily - Return in 2 weeks if it is not improving - See AVS for more

## 2022-11-04 NOTE — Patient Instructions (Signed)
It was wonderful to see you today. Thank you for allowing me to be a part of your care. Below is a short summary of what we discussed at your visit today:  Nexplanon concern This itching and redness over the nexplanon can be a simple complication from this birth control.  Having this reaction does not mean it is any less effective.  There is no sign of infection.   Try the below for 2 weeks: Start daily antihistamine (Claritin, Zyrtec, Allegra, Xyzal, etc.).  Apply Benadryl cream over the area 2-3 times daily.  Come back in 2 weeks if there is still itching and rash   If you have any questions or concerns, please do not hesitate to contact us via phone or MyChart message.   Fayette Pho, MD

## 2022-11-04 NOTE — Progress Notes (Signed)
   SUBJECTIVE:   CHIEF COMPLAINT / HPI:   Concern about newly placed Nexplanon Nexplanon removed and reinserted on 6/04.  The reinsertion occurred at a lower spot on her arm in accordance with new updated placement guidelines. Starting last Wednesday (8 days after insertion) that there was both itching and redness over the new Nexplanon and a little bump over the removal incision. The bump over the removal incision spontaneously resolved, she believes in her sleep. Also had a little bit of pain when reaching overhead, as she works as a Curator. Denies fever, chills, streaking erythema, heat, or drainage from the sites.  PERTINENT  PMH / PSH:  Patient Active Problem List   Diagnosis Date Noted   Breast lump 02/05/2022   Migraine without aura and without status migrainosus, not intractable 09/24/2021   Generalized anxiety disorder 11/04/2019   Nexplanon in place 11/04/2019   PERSONAL HISTORY OF ALLERGY TO PEANUTS 12/11/2007    OBJECTIVE:   BP 124/69   Pulse 65   Wt 213 lb 3.2 oz (96.7 kg)   SpO2 100%   BMI 38.99 kg/m    PHQ-9:     11/04/2022    8:35 AM 10/22/2022    4:04 PM 02/21/2022    3:46 PM  Depression screen PHQ 2/9  Decreased Interest 1 1 1   Down, Depressed, Hopeless 0 0 0  PHQ - 2 Score 1 1 1   Altered sleeping 2 1 1   Tired, decreased energy 1 1 1   Change in appetite 1 1 1   Feeling bad or failure about yourself  0 0 0  Trouble concentrating 2 2 1   Moving slowly or fidgety/restless 2 2 1   Suicidal thoughts 0 0 0  PHQ-9 Score 9 8 6   Difficult doing work/chores Somewhat difficult      Physical Exam General: Awake, alert, oriented, no acute distress Respiratory: Unlabored respirations, speaking in full sentences, no respiratory distress Extremities: Moving all extremities spontaneously, 3 hemostatic healing procedure sites, superior lateral site with scabbing overlying    ASSESSMENT/PLAN:   Nexplanon in place Acute, improving.  Local irritation and rash  starting about a week after Nexplanon placement.  Physical exam reassuring, no signs of infection.  HPI reassuring against infection.  Discussed known complication of local irritation and rash versus allergic reaction to Nexplanon placement, although these are less likely in someone who has previously received the Nexplanon. - Rx Benadryl cream twice daily to 3 times daily - Second-generation antihistamine daily - Return in 2 weeks if it is not improving - See AVS for more     Fayette Pho, MD [facility]

## 2022-11-07 MED ORDER — ETONOGESTREL 68 MG ~~LOC~~ IMPL
68.0000 mg | DRUG_IMPLANT | Freq: Once | SUBCUTANEOUS | Status: AC
Start: 2022-11-07 — End: 2022-10-22
  Administered 2022-10-22: 68 mg via SUBCUTANEOUS

## 2022-11-07 NOTE — Addendum Note (Signed)
Addended by: Veronda Prude on: 11/07/2022 09:34 AM   Modules accepted: Orders

## 2023-03-26 ENCOUNTER — Encounter (HOSPITAL_BASED_OUTPATIENT_CLINIC_OR_DEPARTMENT_OTHER): Payer: Self-pay | Admitting: Emergency Medicine

## 2023-03-26 ENCOUNTER — Emergency Department (HOSPITAL_BASED_OUTPATIENT_CLINIC_OR_DEPARTMENT_OTHER)
Admission: EM | Admit: 2023-03-26 | Discharge: 2023-03-26 | Disposition: A | Discharge status: Home / Self Care | Payer: BLUE CROSS/BLUE SHIELD | Attending: Emergency Medicine | Admitting: Emergency Medicine

## 2023-03-26 DIAGNOSIS — S0990XA Unspecified injury of head, initial encounter: Secondary | ICD-10-CM | POA: Diagnosis present

## 2023-03-26 DIAGNOSIS — W228XXA Striking against or struck by other objects, initial encounter: Secondary | ICD-10-CM | POA: Diagnosis not present

## 2023-03-26 DIAGNOSIS — S060X0A Concussion without loss of consciousness, initial encounter: Secondary | ICD-10-CM
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
## 2023-03-26 NOTE — Discharge Instructions (Addendum)
You were seen in the emergency room today for head injury.  Your symptoms are concerning for concussion.  I would recommending avoiding triggers like screen time and rest.  If you have a sensitivity to light or noise trying eliminate the trigger.  You can alternate Tylenol and ibuprofen for pain control.  Follow-up with primary care to ensure resolution of symptoms.  Return to emergency room with any new or worsening symptoms.
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
## 2023-04-04 ENCOUNTER — Ambulatory Visit (INDEPENDENT_AMBULATORY_CARE_PROVIDER_SITE_OTHER): Payer: BLUE CROSS/BLUE SHIELD | Admitting: Family Medicine

## 2023-04-04 ENCOUNTER — Encounter: Payer: Self-pay | Admitting: Family Medicine

## 2023-04-04 VITALS — BP 132/88 | HR 89 | Ht 62.0 in | Wt 216.4 lb

## 2023-04-04 DIAGNOSIS — Z0000 Encounter for general adult medical examination without abnormal findings: Secondary | ICD-10-CM | POA: Diagnosis not present

## 2023-04-04 DIAGNOSIS — Z23 Encounter for immunization: Secondary | ICD-10-CM | POA: Diagnosis not present

## 2023-04-04 DIAGNOSIS — G43009 Migraine without aura, not intractable, without status migrainosus: Secondary | ICD-10-CM

## 2023-04-04 MED ORDER — SUMATRIPTAN SUCCINATE 50 MG PO TABS
ORAL_TABLET | ORAL | 2 refills | Status: DC
Start: 2023-04-04 — End: 2024-02-09

## 2023-04-04 NOTE — Progress Notes (Signed)
    SUBJECTIVE:   CHIEF COMPLAINT / HPI:   Headaches Nicole Mcneil reports several episodes of migraines every month, lasting 2 to 3 days each.  Bright lights and loud sounds make the episodes worse.  They feel like a "stabbing" and are not relieved by ibuprofen and Excedrin Migraine.  Sleep does help.  She does experience nausea. Has had migraine headaches since onset of puberty.  Mother has history of migraine headaches as well.   PERTINENT  PMH / PSH:  Migraines without aura GAD  OBJECTIVE:   BP 132/88   Pulse 89   Ht 5\' 2"  (1.575 m)   Wt 216 lb 6.4 oz (98.2 kg)   SpO2 99%   BMI 39.58 kg/m   General: Well-appearing, pleasant, no acute distress. HEENT: normocephalic, PERRLA, EOM grossly intact. Cardio: Regular rate, regular rhythm. Pulm: No increased work of breathing. Extremities: no peripheral edema. Moves all extremities equally. Neuro: Alert and oriented x3, speech normal in content. Psych:  Cognition and judgment appear intact. Alert, communicative, and cooperative with normal attention span and concentration.    ASSESSMENT/PLAN:   Migraine without aura and without status migrainosus, not intractable Previously prescribed Imitrex but was unable to fill this prescription due to pharmacy/insurance issues. - Prescribed Imitrex again today, instructed patient to take at the earliest sign of a migraine -Contact me or return to clinic if this medicine does not help - Referral to neurology per patient request  Healthcare maintenance Tdap and HPV given today.  Offered flu, patient declined.  COVID not available at this time in our clinic.  Encourage patient to get flu and COVID at her local pharmacy.  Also due for Pap; she will schedule in the future.     Cyndia Skeeters, DO [facility]

## 2023-04-04 NOTE — Assessment & Plan Note (Signed)
Previously prescribed Imitrex but was unable to fill this prescription due to pharmacy/insurance issues. - Prescribed Imitrex again today, instructed patient to take at the earliest sign of a migraine -Contact me or return to clinic if this medicine does not help - Referral to neurology per patient request

## 2023-04-04 NOTE — Patient Instructions (Signed)
Dear Nicole Mcneil  Today we discussed the following concerns and plans:  Migraines: - I am prescribing a medicine called Imitrex.  Take this at the earliest signs of a migraine. - If you feel like this is not working for you, please send me a message and let me know. - I have also referred you to neurology per your request.  Please let me know if you do not hear from them.  Today you got your HPV and Tdap vaccines. You can return to our clinic for your flu and COVID vaccines or you can get these from your pharmacy.  If you have any concerns, please call the clinic or schedule an appointment.  It was a pleasure to take care of you today. Be well!  Cyndia Skeeters, DO [facility], PGY-1

## 2023-04-04 NOTE — Assessment & Plan Note (Addendum)
Tdap and HPV given today.  Offered flu, patient declined.  COVID not available at this time in our clinic.  Encourage patient to get flu and COVID at her local pharmacy.  Also due for Pap; she will schedule in the future.

## 2023-05-01 ENCOUNTER — Other Ambulatory Visit: Payer: Self-pay

## 2023-05-01 ENCOUNTER — Emergency Department (HOSPITAL_BASED_OUTPATIENT_CLINIC_OR_DEPARTMENT_OTHER)
Admission: EM | Admit: 2023-05-01 | Discharge: 2023-05-01 | Disposition: A | Discharge status: Home / Self Care | Payer: BLUE CROSS/BLUE SHIELD | Attending: Emergency Medicine | Admitting: Emergency Medicine

## 2023-05-01 ENCOUNTER — Encounter (HOSPITAL_BASED_OUTPATIENT_CLINIC_OR_DEPARTMENT_OTHER): Payer: Self-pay

## 2023-05-01 DIAGNOSIS — J45909 Unspecified asthma, uncomplicated: Secondary | ICD-10-CM | POA: Diagnosis not present

## 2023-05-01 DIAGNOSIS — H9209 Otalgia, unspecified ear: Secondary | ICD-10-CM | POA: Diagnosis not present

## 2023-05-01 DIAGNOSIS — Z20822 Contact with and (suspected) exposure to covid-19: Secondary | ICD-10-CM | POA: Diagnosis not present

## 2023-05-01 DIAGNOSIS — R059 Cough, unspecified: Secondary | ICD-10-CM | POA: Diagnosis present

## 2023-05-01 DIAGNOSIS — Z7952 Long term (current) use of systemic steroids: Secondary | ICD-10-CM | POA: Insufficient documentation

## 2023-05-01 DIAGNOSIS — Z7951 Long term (current) use of inhaled steroids: Secondary | ICD-10-CM | POA: Diagnosis not present

## 2023-05-01 DIAGNOSIS — Z8709 Personal history of other diseases of the respiratory system: Secondary | ICD-10-CM

## 2023-05-01 DIAGNOSIS — J069 Acute upper respiratory infection, unspecified: Secondary | ICD-10-CM | POA: Insufficient documentation

## 2023-05-01 LAB — RESP PANEL BY RT-PCR (RSV, FLU A&B, COVID)  RVPGX2
Influenza A by PCR: NEGATIVE
Influenza B by PCR: NEGATIVE
Resp Syncytial Virus by PCR: NEGATIVE
SARS Coronavirus 2 by RT PCR: NEGATIVE

## 2023-05-01 MED ORDER — BENZONATATE 100 MG PO CAPS: 100.0000 mg | ORAL_CAPSULE | Freq: Three times a day (TID) | ORAL | 0 refills | Status: DC

## 2023-05-01 MED ORDER — PREDNISONE 20 MG PO TABS: 40.0000 mg | ORAL_TABLET | Freq: Every day | ORAL | 0 refills | Status: AC

## 2023-05-01 NOTE — Discharge Instructions (Addendum)
You were seen in the emergency department today for cough and shortness of breath.  We tested you for COVID, flu and RSV. You can sign up for [facility] MyChart to access your medical records and test results using this link: [email or web address]  You can take ibuprofen or Tylenol for pain or fever, and I recommend alternating between the 2.  Make sure that you are drinking lots of fluids and getting plenty of rest. You can take decongestants as long as you take them with lots of water. You can use lozenges or chloraseptic spray as needed for sore throat.   Please use Tylenol or ibuprofen for pain.  You may use 600 mg ibuprofen every 6 hours or 1000 mg of Tylenol every 6 hours.  You may choose to alternate between the 2.  This would be most effective.  Do not exceed 4 g of Tylenol within 24 hours.  Do not exceed 3200 mg ibuprofen within 24 hours.  I've sent a course of steroids and prescription cough medicine to your pharmacy.   Continue to monitor how you are doing, and return to the emergency department for new or worsening symptoms such as chest pain, difficulty breathing not related to coughing, fever despite medication, or persistent vomiting or diarrhea.  It has been a pleasure taking care of you today and I hope you begin to feel better soon!

## 2023-05-01 NOTE — ED Provider Notes (Signed)
[place] EMERGENCY DEPARTMENT AT [facility] Provider Note   CSN: [id number] Arrival date & time: 05/01/23  1722     History  Chief Complaint  Patient presents with   Cough    Nicole Mcneil is a 22 y.o. female who presents the emergency department complaining of productive cough, nasal congestion, and some shortness of breath.  Symptoms started about 4 days ago or so.  Does have a history of asthma, has not needed to use her inhaler.  States her symptoms are worse when she is outside working in the cold.  She tried taking some DayQuil, NyQuil, ibuprofen, and Coricidin.  Denies any fever.  Felt warm 1 day but did not check temperature.  Only had a little bit of wheezing, none at present.  Also had some ear and throat pain, but this is resolved.   Cough Associated symptoms: shortness of breath        Home Medications Prior to Admission medications   Medication Sig Start Date End Date Taking? Authorizing Provider  benzonatate (TESSALON) 100 MG capsule Take 1 capsule (100 mg total) by mouth every 8 (eight) hours. 05/01/23  Yes Anissia Wessells T, PA-C  predniSONE (DELTASONE) 20 MG tablet Take 2 tablets (40 mg total) by mouth daily with breakfast for 5 days. 05/01/23 05/06/23 Yes Keishon Chavarin T, PA-C  albuterol (VENTOLIN HFA) 108 (90 Base) MCG/ACT inhaler Inhale 2 puffs into the lungs every 6 (six) hours as needed for wheezing or shortness of breath. 08/21/21   Alfonse Spruce, MD  aspirin-acetaminophen-caffeine (EXCEDRIN MIGRAINE) [phone] MG tablet Take by mouth every 6 (six) hours as needed.    [provider]  diphenhydrAMINE-zinc acetate (BENADRYL EXTRA STRENGTH) cream Apply 1 Application topically 3 (three) times daily as needed for itching. 11/04/22   Valetta Close, MD  fluticasone (FLOVENT HFA) 110 MCG/ACT inhaler Inhale 2 puffs into the lungs in the morning and at bedtime. Add during respiratory flares. 08/21/21   Alfonse Spruce, MD   ibuprofen (ADVIL) 600 MG tablet Take 1 tablet (600 mg total) by mouth every 6 (six) hours as needed. 02/11/22   Rancour, Jeannett Senior, MD  SUMAtriptan (IMITREX) 50 MG tablet Take one tablet at onset of migraine. May repeat in 2 hours if headache persists or recurs. Do not take more than 2 tablets in a day. 04/04/23   Cyndia Skeeters, DO      Allergies    Amoxicillin    Review of Systems   Review of Systems  Respiratory:  Positive for cough and shortness of breath.   All other systems reviewed and are negative.   Physical Exam Updated Vital Signs BP 123/85   Pulse 86   Temp 98.2 F (36.8 C)   Resp 18   Ht 5\' 2"  (1.575 m)   Wt 98 kg   SpO2 100%   BMI 39.51 kg/m  Physical Exam Vitals and nursing note reviewed.  Constitutional:      Appearance: Normal appearance.  HENT:     Head: Normocephalic and atraumatic.     Nose: Congestion present.  Eyes:     Conjunctiva/sclera: Conjunctivae normal.  Cardiovascular:     Rate and Rhythm: Normal rate and regular rhythm.  Pulmonary:     Effort: Pulmonary effort is normal. No respiratory distress.     Breath sounds: Normal breath sounds.  Abdominal:     General: There is no distension.     Palpations: Abdomen is soft.     Tenderness: There is no  abdominal tenderness.  Skin:    General: Skin is warm and dry.  Neurological:     General: No focal deficit present.     Mental Status: She is alert.     ED Results / Procedures / Treatments   Labs (all labs ordered are listed, but only abnormal results are displayed) Labs Reviewed  RESP PANEL BY RT-PCR (RSV, FLU A&B, COVID)  [id number]    EKG None  Radiology No results found.  Procedures Procedures    Medications Ordered in ED Medications - No data to display  ED Course/ Medical Decision Making/ A&P                                 Medical Decision Making Risk Prescription drug management.   This patient is a 22 y.o. female who presents to the ED for concern of cough and  shortness of breath.   Differential diagnoses prior to evaluation: The emergent differential diagnosis includes, but is not limited to,  upper respiratory infection, lower respiratory infection, allergies, asthma, irritants, sinus/esophageal foreign body, medications, reflux, interstitial lung disease, postnasal drip, viral illness, sepsis. This is not an exhaustive differential.   Past Medical History / Co-morbidities / Additional history: Chart reviewed. Pertinent results include: asthma  Physical Exam: Physical exam performed. The pertinent findings include: Normal vital signs, no acute distress.  Normal respiratory effort, normal SpO2 on room air, lung sounds clear.  Lab Tests/Imaging studies: I personally interpreted labs/imaging and the pertinent results include:  respiratory panel pending, patient understands to follow up the results on MyChart.    Disposition: After consideration of the diagnostic results and the patients response to treatment, I feel that emergency department workup does not suggest an emergent condition requiring admission or immediate intervention beyond what has been performed at this time. Patient with symptoms consistent with viral URI.  Vitals are stable. No signs of dehydration, tolerating PO's.  Lungs are clear.   The plan is: Patient will be discharged with instructions to orally hydrate, rest, and use over-the-counter medications such as anti-inflammatories such as ibuprofen and Tylenol for fever. Given prescription for steroid burst and tessalon. The patient is safe for discharge and has been instructed to return immediately for worsening symptoms, change in symptoms or any other concerns.  Final Clinical Impression(s) / ED Diagnoses Final diagnoses:  Viral URI with cough  History of asthma    Rx / DC Orders ED Discharge Orders          Ordered    predniSONE (DELTASONE) 20 MG tablet  Daily with breakfast        05/01/23 1832    benzonatate  (TESSALON) 100 MG capsule  Every 8 hours        05/01/23 1832           Portions of this report may have been transcribed using voice recognition software. Every effort was made to ensure accuracy; however, inadvertent computerized transcription errors may be present.    Jeanella Flattery 05/01/23 1834    Rondel Baton, MD 05/03/23 1324

## 2023-05-01 NOTE — ED Triage Notes (Signed)
In for eval of trouble breathing, productive cough with green sputum, nasal congestion and felt warm one day but did not check temp. Onset Sunday. Took nyquil, dayquil, ibuprofen, and Coricidin.

## 2023-08-04 ENCOUNTER — Encounter: Payer: Self-pay | Admitting: Neurology

## 2023-08-04 ENCOUNTER — Ambulatory Visit (INDEPENDENT_AMBULATORY_CARE_PROVIDER_SITE_OTHER): Payer: BLUE CROSS/BLUE SHIELD | Admitting: Neurology

## 2023-08-04 VITALS — BP 123/83 | HR 72 | Ht 62.0 in | Wt 219.0 lb

## 2023-08-04 DIAGNOSIS — G4440 Drug-induced headache, not elsewhere classified, not intractable: Secondary | ICD-10-CM | POA: Diagnosis not present

## 2023-08-04 DIAGNOSIS — G43709 Chronic migraine without aura, not intractable, without status migrainosus: Secondary | ICD-10-CM | POA: Diagnosis not present

## 2023-08-04 MED ORDER — TOPIRAMATE 100 MG PO TABS: 100.0000 mg | ORAL_TABLET | Freq: Two times a day (BID) | ORAL | 11 refills | Status: AC

## 2023-08-04 MED ORDER — TIZANIDINE HCL 4 MG PO TABS: 4.0000 mg | ORAL_TABLET | Freq: Four times a day (QID) | ORAL | 6 refills | Status: DC | PRN

## 2023-08-04 MED ORDER — ONDANSETRON 4 MG PO TBDP: 4.0000 mg | ORAL_TABLET | Freq: Three times a day (TID) | ORAL | 6 refills | Status: DC | PRN

## 2023-08-04 MED ORDER — RIZATRIPTAN BENZOATE 10 MG PO TBDP
10.0000 mg | ORAL_TABLET | ORAL | 6 refills | Status: DC | PRN
Start: 2023-08-04 — End: 2024-02-09

## 2023-08-04 NOTE — Patient Instructions (Signed)
 Meds ordered this encounter  Medications   topiramate (TOPAMAX) 100 MG tablet    Sig: Take 1 tablet (100 mg total) by mouth 2 (two) times daily.    Dispense:  60 tablet    Refill:  11  1 rizatriptan (MAXALT-MLT) 10 MG disintegrating tablet    Sig: Take 1 tablet (10 mg total) by mouth as needed. May repeat in 2 hours if needed    Dispense:  12 tablet    Refill:  6  2 ondansetron (ZOFRAN-ODT) 4 MG disintegrating tablet    Sig: Take 1 tablet (4 mg total) by mouth every 8 (eight) hours as needed for nausea or vomiting.    Dispense:  20 tablet    Refill:  6  3 tiZANidine (ZANAFLEX) 4 MG tablet    Sig: Take 1 tablet (4 mg total) by mouth every 6 (six) hours as needed for muscle spasms.    Dispense:  30 tablet    Refill:  6

## 2023-08-04 NOTE — Progress Notes (Signed)
 Chief Complaint  Patient presents with   New Patient (Initial Visit)    Pt in 15, here alone Pt is referred for migraines. Pt states she is having daily migraines, states she takes Excedrin migraine, states it helps dull the pain but does not get rid of it. Sumatriptan made migraines worse. Pt reports nausea, lightheadedness, sensitivity to light, and sounds.       ASSESSMENT AND PLAN  Nicole Mcneil is a 23 y.o. female   Chronic migraine Medication overuse rebound headache  Starting topamax 100 mg titrating to twice a day as migraine prevention  Maxalt as needed, may combine with tizanidine, Zofran as needed for severe prolonged headaches  Suboptimal response to Imitrex  Return To Clinic With NP In 6 Months   DIAGNOSTIC DATA (LABS, IMAGING, TESTING) - I reviewed patient records, labs, notes, testing and imaging myself where available.   MEDICAL HISTORY:  Nicole Mcneil, is a 23 year old female, seen in request by his primary care doctor Cyndia Skeeters, for evaluation of headache, initial evaluation August 04, 2023  History is obtained from the patient and review of electronic medical records. I personally reviewed pertinent available imaging films in PACS.   PMHx of  Asthma  She began to have frequent migraines since 23 years old, getting worse since 2024, having 4-5 migraine each week, holoacranial severe throbbing headache with light, noise sensitivity, nauseous,  She was taking frequent Excedrin Migraine, initially was helpful for migraine, then gradually it loses its benefit,  Tried Imitrex a few times, did not help her headache much," squeeze my head", increased pressure after taking Imitrex  She also has weight issues, denies visual change, last ophthalmology evaluation was a year ago, no abnormality found, wearing glasses  PHYSICAL EXAM:   Vitals:   08/04/23 0834  BP: 123/83  Pulse: 72  Weight: 219 lb (99.3 kg)  Height: 5\' 2"  (1.575 m)   Not  recorded     Body mass index is 40.06 kg/m.  PHYSICAL EXAMNIATION:  Gen: NAD, conversant, well nourised, well groomed                     Cardiovascular: Regular rate rhythm, no peripheral edema, warm, nontender. Eyes: Conjunctivae clear without exudates or hemorrhage Neck: Supple, no carotid bruits. Pulmonary: Clear to auscultation bilaterally   NEUROLOGICAL EXAM:  MENTAL STATUS: Speech/cognition: Obesity, awake, alert, oriented to history taking and casual conversation CRANIAL NERVES: CN II: Visual fields are full to confrontation. Pupils are round equal and briskly reactive to light.  Sharp disc bilaterally CN III, IV, VI: extraocular movement are normal. No ptosis. CN V: Facial sensation is intact to light touch CN VII: Face is symmetric with normal eye closure  CN VIII: Hearing is normal to causal conversation. CN IX, X: Phonation is normal. CN XI: Head turning and shoulder shrug are intact  MOTOR: There is no pronator drift of out-stretched arms. Muscle bulk and tone are normal. Muscle strength is normal.  REFLEXES: Reflexes are 2+ and symmetric at the biceps, triceps, knees, and ankles. Plantar responses are flexor.  SENSORY: Intact to light touch, pinprick and vibratory sensation are intact in fingers and toes.  COORDINATION: There is no trunk or limb dysmetria noted.  GAIT/STANCE: Posture is normal. Gait is steady    REVIEW OF SYSTEMS:  Full 14 system review of systems performed and notable only for as above All other review of systems were negative.   ALLERGIES: Allergies  Allergen Reactions   Amoxicillin  HOME MEDICATIONS: Current Outpatient Medications  Medication Sig Dispense Refill   albuterol (VENTOLIN HFA) 108 (90 Base) MCG/ACT inhaler Inhale 2 puffs into the lungs every 6 (six) hours as needed for wheezing or shortness of breath. 8 g 2   aspirin-acetaminophen-caffeine (EXCEDRIN MIGRAINE) 250-250-65 MG tablet Take by mouth every 6 (six)  hours as needed.     fluticasone (FLOVENT HFA) 110 MCG/ACT inhaler Inhale 2 puffs into the lungs in the morning and at bedtime. Add during respiratory flares. 1 each 12   ibuprofen (ADVIL) 600 MG tablet Take 1 tablet (600 mg total) by mouth every 6 (six) hours as needed. (Patient not taking: Reported on 08/04/2023) 30 tablet 0   SUMAtriptan (IMITREX) 50 MG tablet Take one tablet at onset of migraine. May repeat in 2 hours if headache persists or recurs. Do not take more than 2 tablets in a day. (Patient not taking: Reported on 08/04/2023) 10 tablet 2   No current facility-administered medications for this visit.    PAST MEDICAL HISTORY: Past Medical History:  Diagnosis Date   Asthma    Encounter for counseling regarding initiation of other contraceptive measure 11/04/2019   Need for HPV vaccine 02/22/2022   Routine screening for STI (sexually transmitted infection) 02/22/2022    PAST SURGICAL HISTORY: Past Surgical History:  Procedure Laterality Date   WISDOM TOOTH EXTRACTION      FAMILY HISTORY: Family History  Problem Relation Age of Onset   Seizures Mother    Migraines Mother    Hypertension Mother    Asthma Mother    Hyperlipidemia Mother    Clotting disorder Father    Seizures Brother    Cancer Neg Hx     SOCIAL HISTORY: Social History   Socioeconomic History   Marital status: Single    Spouse name: Not on file   Number of children: Not on file   Years of education: Not on file   Highest education level: Not on file  Occupational History   Not on file  Tobacco Use   Smoking status: Never   Smokeless tobacco: Never  Vaping Use   Vaping status: Never Used  Substance and Sexual Activity   Alcohol use: Yes    Alcohol/week: 1.0 standard drink of alcohol    Types: 1 Shots of liquor per week    Comment: Occ   Drug use: Not Currently   Sexual activity: Never  Other Topics Concern   Not on file  Social History Narrative   Lives with mom, aunt and uncle and  grandma and little sister.   Social Drivers of Corporate investment banker Strain: Not on file  Food Insecurity: Not on file  Transportation Needs: Not on file  Physical Activity: Not on file  Stress: Not on file  Social Connections: Unknown (10/02/2021)   Received from [facility]   Social Network    Social Network: Not on file  Intimate Partner Violence: Unknown (08/24/2021)   Received from [facility]   HITS    Physically Hurt: Not on file    Insult or Talk Down To: Not on file    Threaten Physical Harm: Not on file    Scream or Curse: Not on file      Levert Feinstein, M.D. Ph.D.  [facility] [address] [place], [place] [address] Ph: [phone] Fax: [phone]  CC:  Carney Living, MD [address] [place],  [place] [address]  Cyndia Skeeters,  DO

## 2024-02-02 ENCOUNTER — Telehealth: Payer: Self-pay | Admitting: Neurology

## 2024-02-02 NOTE — Telephone Encounter (Signed)
 Patient called to confirm appointment.

## 2024-02-05 NOTE — Progress Notes (Signed)
 Chief Complaint  Patient presents with   Follow-up    Pt in 3 alone Pt here for Migraine f/u  Pt states migraines are better Pt states 5 migraines in last month      ASSESSMENT AND PLAN  Nicole Mcneil is a 23 y.o. female   Chronic migraine with and without aura  Overall greatly improved with about 2-5 migraines per month Continue topiramate  100 mg twice daily - would not recommend higher dose due to decreased effectiveness of contraceptive measures. If migraines worsen, will need to consider adjunctive therapy Start Nurtec as needed for migraine rescue, advised no more than 1 tab in 24 hours.  Continue tizanidine  and Zofran  as needed Previously tried/failed: Sumatriptan , rizatriptan , Fioricet, ibuprofen , Excedrin, tizanidine , Zofran     Follow-up in 8-9 months or call earlier if needed    DIAGNOSTIC DATA (LABS, IMAGING, TESTING) - I reviewed patient records, labs, notes, testing and imaging myself where available.  02/16/2018 CT head IMPRESSION: Normal head CT     MEDICAL HISTORY:  Update 02/09/2024 JM: Patient returns for 52-month follow-up visit unaccompanied.  She reports migraines have greatly improved on topiramate  100 mg twice daily.  She did have 5 migraine headaches in the past month but was not as frequent in June and July.  She feels recent increase likely due to stress at work.  She did try rizatriptan  but had significant fatigue after taking.  She has been using ibuprofen  as needed but denies overuse.  Will use tizanidine  and Zofran  on occasion with benefit.  She can occasionally have blurred vision prior to onset of migraine.  She currently uses Nexplanon  for contraceptive measures.      Consult visit 08/04/2023 Dr. Onita: [name], is a 23 year old female, seen in request by his primary care doctor Lafe Domino, for evaluation of headache, initial evaluation August 04, 2023  History is obtained from the patient and review of electronic medical  records. I personally reviewed pertinent available imaging films in PACS.   PMHx of  Asthma  She began to have frequent migraines since 23 years old, getting worse since 2024, having 4-5 migraine each week, holoacranial severe throbbing headache with light, noise sensitivity, nauseous,  She was taking frequent Excedrin Migraine, initially was helpful for migraine, then gradually it loses its benefit,  Tried Imitrex  a few times, did not help her headache much, squeeze my head, increased pressure after taking Imitrex   She also has weight issues, denies visual change, last ophthalmology evaluation was a year ago, no abnormality found, wearing glasses    PHYSICAL EXAM:   Vitals:   02/09/24 0806  BP: 116/72  Pulse: 77  Weight: 226 lb (102.5 kg)  Height: 5' 2 (1.575 m)   Body mass index is 41.34 kg/m.  PHYSICAL EXAMNIATION:  Gen: NAD, very pleasant young African-American female, conversant, well nourised, well groomed                     Cardiovascular: Regular rate rhythm, no peripheral edema, warm, nontender. Eyes: Conjunctivae clear without exudates or hemorrhage Neck: Supple, no carotid bruits. Pulmonary: Clear to auscultation bilaterally   NEUROLOGICAL EXAM:  MENTAL STATUS: Speech/cognition: Obesity, awake, alert, oriented to history taking and casual conversation CRANIAL NERVES: CN II: Visual fields are full to confrontation. Pupils are round equal and briskly reactive to light.  CN III, IV, VI: extraocular movement are normal. No ptosis. CN V: Facial sensation is intact to light touch CN VII: Face is symmetric with normal  eye closure  CN VIII: Hearing is normal to causal conversation. CN IX, X: Phonation is normal. CN XI: Head turning and shoulder shrug are intact  MOTOR: There is no pronator drift of out-stretched arms. Muscle bulk and tone are normal. Muscle strength is normal.  REFLEXES: Reflexes are 2+ and symmetric at the biceps, triceps, knees, and  ankles. Plantar responses are flexor.  SENSORY: Intact to light touch, pinprick and vibratory sensation are intact in fingers and toes.  COORDINATION: There is no trunk or limb dysmetria noted.  GAIT/STANCE: Posture is normal. Gait is steady      REVIEW OF SYSTEMS:  Full 14 system review of systems performed and notable only for as above All other review of systems were negative.   ALLERGIES: Allergies  Allergen Reactions   Amoxicillin      HOME MEDICATIONS: Current Outpatient Medications  Medication Sig Dispense Refill   albuterol  (VENTOLIN  HFA) 108 (90 Base) MCG/ACT inhaler Inhale 2 puffs into the lungs every 6 (six) hours as needed for wheezing or shortness of breath. 8 g 2   fluticasone  (FLOVENT  HFA) 110 MCG/ACT inhaler Inhale 2 puffs into the lungs in the morning and at bedtime. Add during respiratory flares. 1 each 12   ibuprofen  (ADVIL ) 600 MG tablet Take 1 tablet (600 mg total) by mouth every 6 (six) hours as needed. (Patient taking differently: Take 600 mg by mouth as needed.) 30 tablet 0   ondansetron  (ZOFRAN -ODT) 4 MG disintegrating tablet Take 1 tablet (4 mg total) by mouth every 8 (eight) hours as needed for nausea or vomiting. 20 tablet 6   SUMAtriptan  (IMITREX ) 50 MG tablet Take one tablet at onset of migraine. May repeat in 2 hours if headache persists or recurs. Do not take more than 2 tablets in a day. 10 tablet 2   tiZANidine  (ZANAFLEX ) 4 MG tablet Take 1 tablet (4 mg total) by mouth every 6 (six) hours as needed for muscle spasms. 30 tablet 6   topiramate  (TOPAMAX ) 100 MG tablet Take 1 tablet (100 mg total) by mouth 2 (two) times daily. 60 tablet 11   aspirin-acetaminophen -caffeine (EXCEDRIN MIGRAINE) 250-250-65 MG tablet Take by mouth every 6 (six) hours as needed.     rizatriptan  (MAXALT -MLT) 10 MG disintegrating tablet Take 1 tablet (10 mg total) by mouth as needed. May repeat in 2 hours if needed 12 tablet 6   No current facility-administered medications  for this visit.    PAST MEDICAL HISTORY: Past Medical History:  Diagnosis Date   Asthma    Encounter for counseling regarding initiation of other contraceptive measure 11/04/2019   Need for HPV vaccine 02/22/2022   Routine screening for STI (sexually transmitted infection) 02/22/2022    PAST SURGICAL HISTORY: Past Surgical History:  Procedure Laterality Date   WISDOM TOOTH EXTRACTION      FAMILY HISTORY: Family History  Problem Relation Age of Onset   Seizures Mother    Migraines Mother    Hypertension Mother    Asthma Mother    Hyperlipidemia Mother    Clotting disorder Father    Seizures Brother    Migraines Maternal Grandmother    Cancer Neg Hx     SOCIAL HISTORY: Social History   Socioeconomic History   Marital status: Single    Spouse name: Not on file   Number of children: Not on file   Years of education: Not on file   Highest education level: Not on file  Occupational History   Not on file  Tobacco  Use   Smoking status: Never   Smokeless tobacco: Never  Vaping Use   Vaping status: Never Used  Substance and Sexual Activity   Alcohol use: Yes    Alcohol/week: 1.0 standard drink of alcohol    Types: 1 Shots of liquor per week    Comment: Occ   Drug use: Not Currently   Sexual activity: Never  Other Topics Concern   Not on file  Social History Narrative   Lives with mom, aunt and uncle and grandma and little sister.   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 [employer] Insecurity: Not on file  Transportation Needs: Not on file  Physical Activity: Not on file  Stress: Not on file  Social Connections: Unknown (10/02/2021)   [facility] from [facility]   Social Network    Social Network: Not on file  Intimate Partner Violence: Unknown (08/24/2021)   Received from [facility]   HITS    Physically Hurt: Not on file    Insult or Talk Down To: Not on file    Threaten Physical Harm: Not on file    Scream or  Curse: Not on file      I personally spent a total of 26 minutes in the care of the patient today including preparing to see the patient, performing a medically appropriate exam/evaluation, counseling and educating, placing orders, and documenting clinical information in the EHR.  Harlene Bogaert, AGNP-BC  [facility] [address] [place], [place] [address]  Phone [phone] Fax [phone] Note: This document was prepared with digital dictation and possible smart phrase technology. Any transcriptional errors that result from this process are unintentional.

## 2024-02-09 ENCOUNTER — Encounter: Payer: Self-pay | Admitting: Adult Health

## 2024-02-09 ENCOUNTER — Ambulatory Visit (INDEPENDENT_AMBULATORY_CARE_PROVIDER_SITE_OTHER): Admitting: Adult Health

## 2024-02-09 VITALS — BP 116/72 | HR 77 | Ht 62.0 in | Wt 226.0 lb

## 2024-02-09 DIAGNOSIS — G43009 Migraine without aura, not intractable, without status migrainosus: Secondary | ICD-10-CM | POA: Diagnosis not present

## 2024-02-09 DIAGNOSIS — G43109 Migraine with aura, not intractable, without status migrainosus: Secondary | ICD-10-CM

## 2024-02-09 MED ORDER — ONDANSETRON 4 MG PO TBDP
4.0000 mg | ORAL_TABLET | Freq: Three times a day (TID) | ORAL | 6 refills | Status: AC | PRN
Start: 2024-02-09 — End: ?

## 2024-02-09 MED ORDER — TIZANIDINE HCL 4 MG PO TABS
4.0000 mg | ORAL_TABLET | Freq: Four times a day (QID) | ORAL | 11 refills | Status: AC | PRN
Start: 2024-02-09 — End: ?

## 2024-02-09 MED ORDER — NURTEC 75 MG PO TBDP
75.0000 mg | ORAL_TABLET | ORAL | 11 refills | Status: AC | PRN
Start: 2024-02-09 — End: ?

## 2024-02-09 NOTE — Patient Instructions (Addendum)
 Your Plan:  Continue topamax  100mg  twice daily for migraine prevention  Start Nurtec as needed for migraine rescue - do not take more than 1 in a 24 hour period   Please let me know if this is not beneficial and we can try a different option  Please let me know if migraines should worsen to discuss other treatment options      Follow up in 8-9 months or call earlier if needed      Thank you for coming to see us  at [facility]. I hope we have been able to provide you high quality care today.  You may receive a patient satisfaction survey over the next few weeks. We would appreciate your feedback and comments so that we may continue to improve ourselves and the health of our patients.

## 2024-02-13 IMAGING — DX DG CHEST 2V
2 series · 2 of 2 positions shown · non-contrast
Comparison: None.

CLINICAL DATA: Chest pain

EXAM:
CHEST - 2 VIEW

[chest pa]
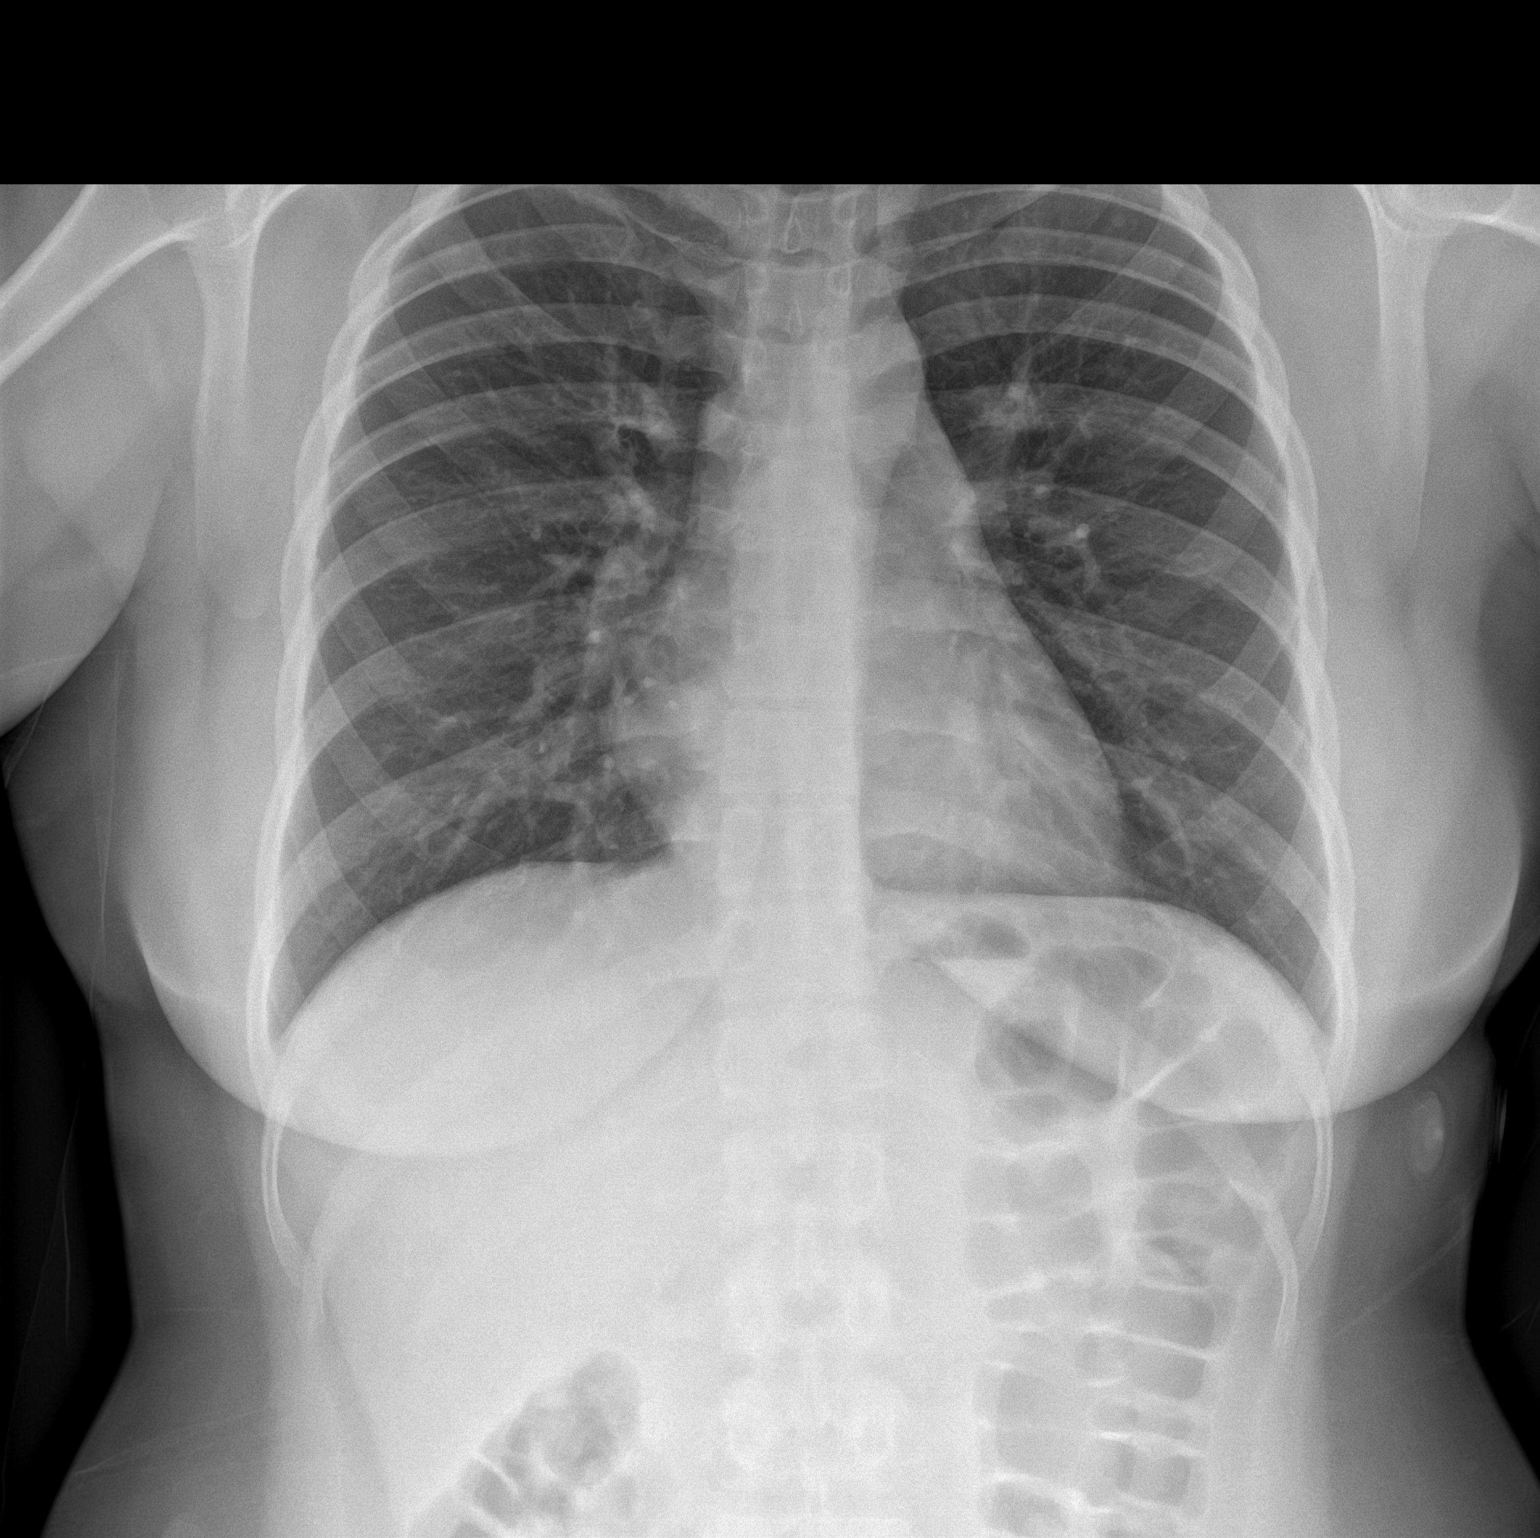

[chest lat]
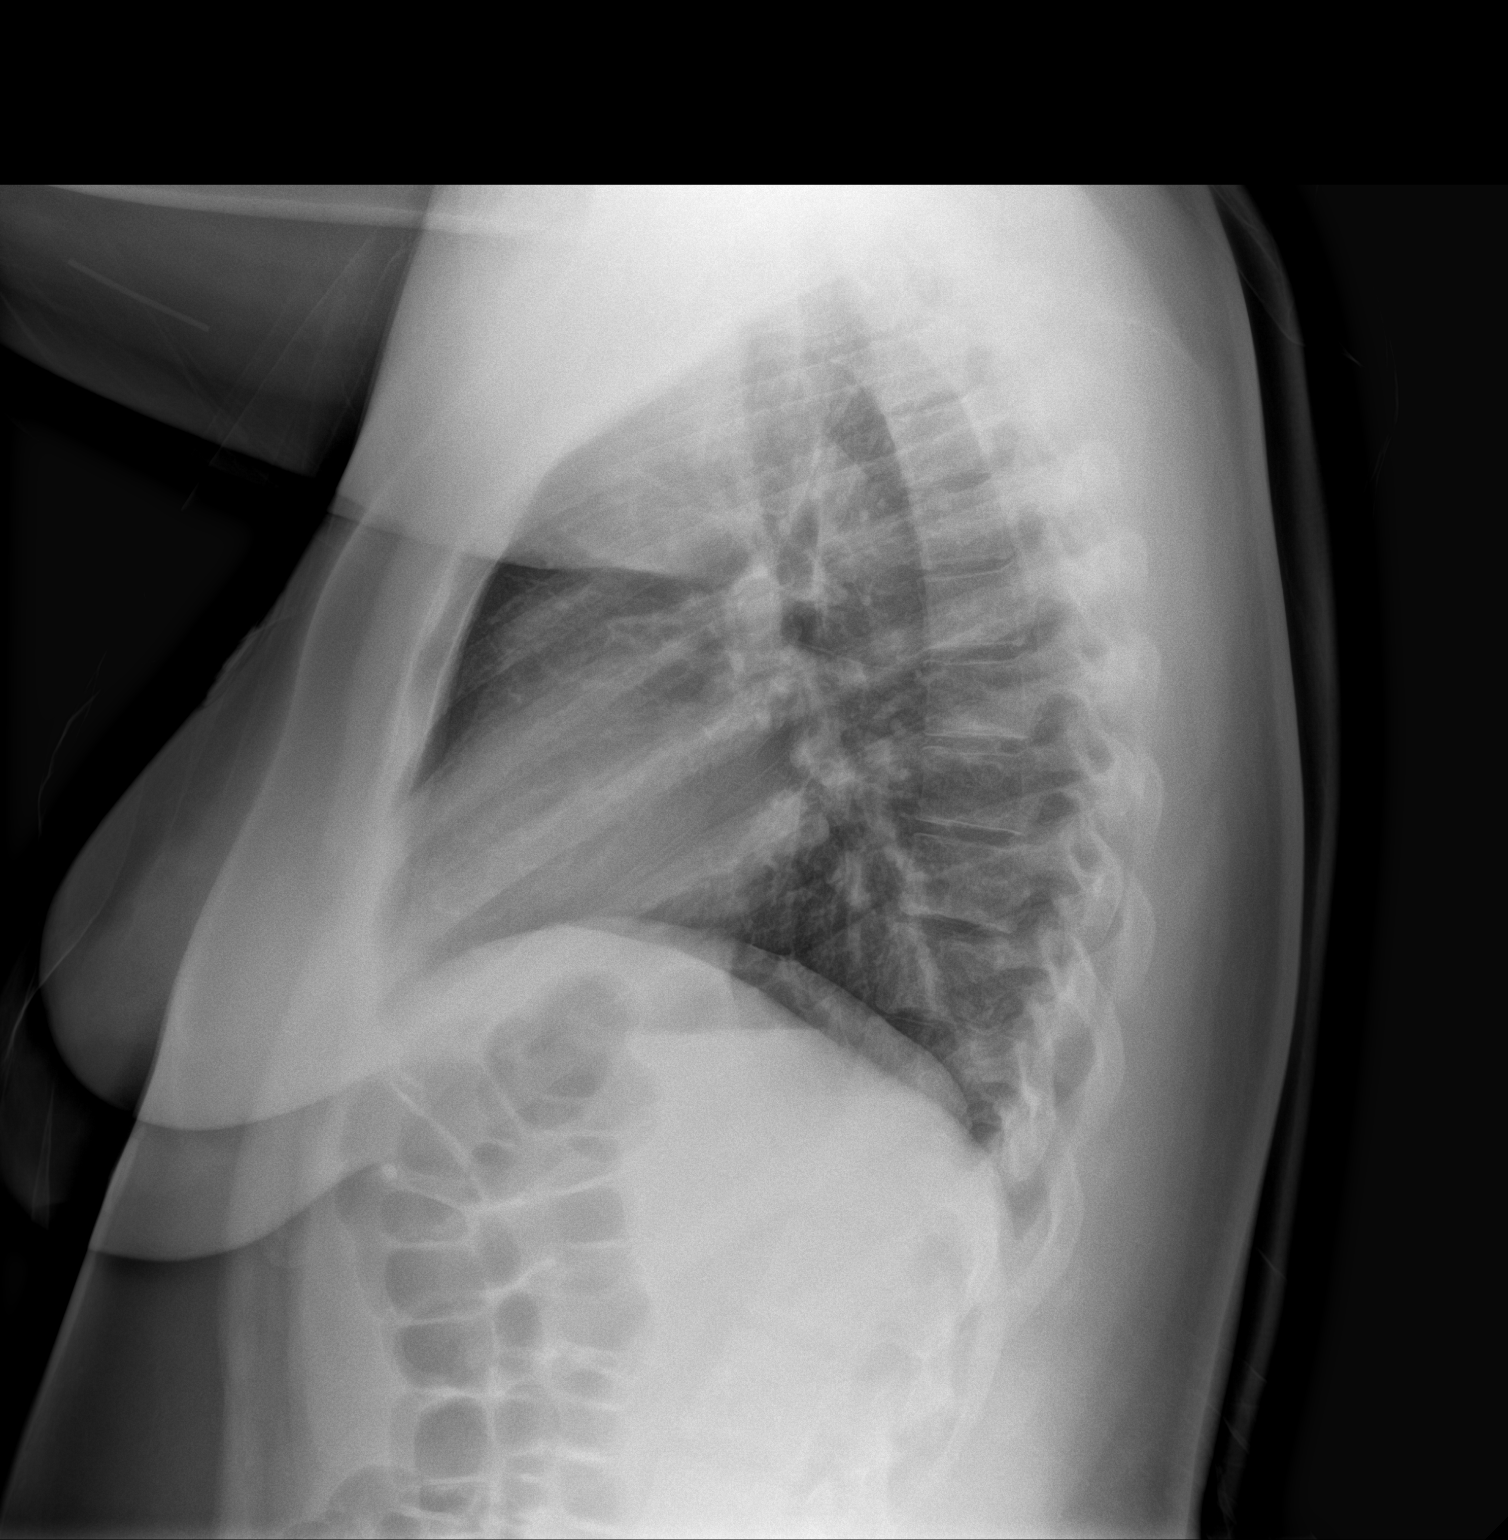

[2 of 2 positions shown; findings below may reference images not displayed]

FINDINGS: The heart size and mediastinal contours are within normal limits.
Both lungs are clear. The visualized skeletal structures are
unremarkable.
IMPRESSION: Negative.

## 2024-10-12 ENCOUNTER — Ambulatory Visit: Admitting: Adult Health
